# Patient Record
Sex: Female | Born: 1990
Health system: Southern US, Community
[De-identification: ages and names within clinical notes are randomized; demographics above are authoritative.]

## PROBLEM LIST (undated history)

## (undated) DIAGNOSIS — R87629 Unspecified abnormal cytological findings in specimens from vagina: Secondary | ICD-10-CM

## (undated) DIAGNOSIS — Z789 Other specified health status: Secondary | ICD-10-CM

## (undated) HISTORY — PX: NO PAST SURGERIES: SHX2092

## (undated) HISTORY — DX: Other specified health status: Z78.9

## (undated) HISTORY — DX: Unspecified abnormal cytological findings in specimens from vagina: R87.629

---
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
## 2011-07-08 ENCOUNTER — Ambulatory Visit (INDEPENDENT_AMBULATORY_CARE_PROVIDER_SITE_OTHER): Payer: Self-pay | Admitting: Family Medicine

## 2011-07-08 ENCOUNTER — Other Ambulatory Visit (HOSPITAL_COMMUNITY)
Admission: RE | Admit: 2011-07-08 | Discharge: 2011-07-08 | Disposition: A | Discharge status: Home / Self Care | Payer: Self-pay | Source: Ambulatory Visit | Attending: Family Medicine | Admitting: Family Medicine

## 2011-07-08 ENCOUNTER — Encounter: Payer: Self-pay | Admitting: Family Medicine

## 2011-07-08 VITALS — BP 104/74 | HR 66 | Temp 98.1°F | Ht 61.0 in | Wt 127.4 lb

## 2011-07-08 DIAGNOSIS — Z0000 Encounter for general adult medical examination without abnormal findings: Secondary | ICD-10-CM

## 2011-07-08 DIAGNOSIS — Z01419 Encounter for gynecological examination (general) (routine) without abnormal findings: Secondary | ICD-10-CM | POA: Insufficient documentation

## 2011-07-08 LAB — LIPID PANEL
Cholesterol: 144 mg/dL (ref 0–200)
VLDL: 10.8 mg/dL (ref 0.0–40.0)

## 2011-07-08 LAB — TSH: TSH: 0.87 u[IU]/mL (ref 0.35–5.50)

## 2011-07-08 LAB — HEPATIC FUNCTION PANEL
ALT: 23 U/L (ref 0–35)
AST: 29 U/L (ref 0–37)
Albumin: 4.1 g/dL (ref 3.5–5.2)
Alkaline Phosphatase: 71 U/L (ref 39–117)
Total Protein: 7.6 g/dL (ref 6.0–8.3)

## 2011-07-08 LAB — POCT URINALYSIS DIPSTICK
Ketones, UA: NEGATIVE
Leukocytes, UA: NEGATIVE
Nitrite, UA: NEGATIVE
Protein, UA: NEGATIVE

## 2011-07-08 LAB — CBC WITH DIFFERENTIAL/PLATELET
Basophils Absolute: 0 10*3/uL (ref 0.0–0.1)
Eosinophils Relative: 2.9 % (ref 0.0–5.0)
Lymphocytes Relative: 48.9 % — ABNORMAL HIGH (ref 12.0–46.0)
Monocytes Relative: 8.7 % (ref 3.0–12.0)
Neutrophils Relative %: 38.6 % — ABNORMAL LOW (ref 43.0–77.0)
Platelets: 230 10*3/uL (ref 150.0–400.0)
RDW: 13.4 % (ref 11.5–14.6)
WBC: 3.6 10*3/uL — ABNORMAL LOW (ref 4.5–10.5)

## 2011-07-08 LAB — BASIC METABOLIC PANEL
BUN: 11 mg/dL (ref 6–23)
CO2: 27 mEq/L (ref 19–32)
Chloride: 105 mEq/L (ref 96–112)
GFR: 90.52 mL/min (ref >60.00)
Glucose, Bld: 59 mg/dL — ABNORMAL LOW (ref 70–99)
Potassium: 3.9 mEq/L (ref 3.5–5.1)

## 2011-07-08 LAB — POCT URINE PREGNANCY: Preg Test, Ur: NEGATIVE

## 2011-07-08 NOTE — Patient Instructions (Signed)

## 2011-07-08 NOTE — Progress Notes (Signed)
  Subjective:     Erica Kaiser is a 20 y.o. female and is here for a comprehensive physical exam. The patient reports problems - + irregular periods.  History   Social History  . Marital Status: Single    Spouse Name: N/A    Number of Children: N/A  . Years of Education: N/A   Occupational History  . [employer] student    Social History Main Topics  . Smoking status: Never Smoker   . Smokeless tobacco: Never Used  . Alcohol Use: No  . Drug Use: No  . Sexually Active: Yes -- Female partner(s)   Other Topics Concern  . Not on file   Social History Narrative   Exercises daily for 3-4 hours   No health maintenance topics applied.  The following portions of the patient's history were reviewed and updated as appropriate: allergies, current medications, past family history, past medical history, past social history, past surgical history and problem list.  Review of Systems Review of Systems  Constitutional: Negative for activity change, appetite change and fatigue.  HENT: Negative for hearing loss, congestion, tinnitus and ear discharge.  dentist q73m Eyes: Negative for visual disturbance (see optho q1y -- vision corrected to 20/20 with glasses).  Respiratory: Negative for cough, chest tightness and shortness of breath.   Cardiovascular: Negative for chest pain, palpitations and leg swelling.  Gastrointestinal: Negative for abdominal pain, diarrhea, constipation and abdominal distention.  Genitourinary: Negative for urgency, frequency, decreased urine volume and difficulty urinating.  Musculoskeletal: Negative for back pain, arthralgias and gait problem.  Skin: Negative for color change, pallor and rash.  Neurological: Negative for dizziness, light-headedness, numbness and headaches.  Hematological: Negative for adenopathy. Does not bruise/bleed easily.  Psychiatric/Behavioral: Negative for suicidal ideas, confusion, sleep disturbance, self-injury, dysphoric mood, decreased  concentration and agitation.       Objective:    BP 104/74  Pulse 66  Temp(Src) 98.1 F (36.7 C) (Oral)  Ht 5\' 1"  (1.549 m)  Wt 127 lb 6.4 oz (57.788 kg)  BMI 24.07 kg/m2  SpO2 99%  LMP 02/19/2011 General appearance: alert, cooperative, appears stated age and no distress Head: Normocephalic, without obvious abnormality, atraumatic Eyes: conjunctivae/corneas clear. PERRL, EOM's intact. Fundi benign. Ears: normal TM's and external ear canals both ears Nose: Nares normal. Septum midline. Mucosa normal. No drainage or sinus tenderness. Throat: lips, mucosa, and tongue normal; teeth and gums normal Neck: no adenopathy, no carotid bruit, no JVD, supple, symmetrical, trachea midline and thyroid not enlarged, symmetric, no tenderness/mass/nodules Back: symmetric, no curvature. ROM normal. No CVA tenderness. Lungs: clear to auscultation bilaterally Breasts: normal appearance, no masses or tenderness Heart: regular rate and rhythm, S1, S2 normal, no murmur, click, rub or gallop Abdomen: soft, non-tender; bowel sounds normal; no masses,  no organomegaly Pelvic: cervix normal in appearance, external genitalia normal, no adnexal masses or tenderness, no cervical motion tenderness, rectovaginal septum normal, uterus normal size, shape, and consistency and vagina normal without discharge Extremities: extremities normal, atraumatic, no cyanosis or edema Pulses: 2+ and symmetric Skin: Skin color, texture, turgor normal. No rashes or lesions Lymph nodes: Cervical, supraclavicular, and axillary nodes normal. Neurologic: Alert and oriented X 3, normal strength and tone. Normal symmetric reflexes. Normal coordination and gait psych--no depression, no anxiety    Assessment:    Healthy female exam Irregular periods---start yaz     Plan:    check fasting labs  ghm utd  See After Visit Summary for Counseling Recommendations

## 2011-07-09 ENCOUNTER — Telehealth: Payer: Self-pay | Admitting: Family Medicine

## 2011-07-09 MED ORDER — NORGESTIMATE-ETH ESTRADIOL 0.25-35 MG-MCG PO TABS: 1.0000 | ORAL_TABLET | Freq: Every day | ORAL | Status: DC

## 2011-07-09 NOTE — Telephone Encounter (Signed)
Left Detail message that Rx sent to pharmacy and to contact office with any further concerns.

## 2011-07-09 NOTE — Telephone Encounter (Signed)
Please advise 

## 2011-07-09 NOTE — Telephone Encounter (Signed)
Can switch to Sprintec (ortho-cyclen) daily, disp 1 pill pack, 11 refills

## 2011-07-13 ENCOUNTER — Telehealth: Payer: Self-pay

## 2011-07-13 DIAGNOSIS — IMO0002 Reserved for concepts with insufficient information to code with codable children: Secondary | ICD-10-CM

## 2011-07-13 DIAGNOSIS — R87610 Atypical squamous cells of undetermined significance on cytologic smear of cervix (ASC-US): Secondary | ICD-10-CM

## 2011-07-13 NOTE — Telephone Encounter (Signed)
Message copied by Arnette Norris on Tue Jul 13, 2011  8:37 AM ------      Message from: Lelon Perla      Created: Mon Jul 12, 2011  9:00 AM       + HPV---LSIL---needs GYN referral for colposcopy------

## 2011-07-13 NOTE — Telephone Encounter (Signed)
msg left to call the office     KP 

## 2011-07-15 NOTE — Telephone Encounter (Signed)
msg left to call...referral put in     KP

## 2011-07-16 NOTE — Telephone Encounter (Signed)
msg left for mother to return call     KP

## 2011-07-20 NOTE — Telephone Encounter (Signed)
After several attempts to contact patient ----Letter has been mailed    KP

## 2011-07-22 ENCOUNTER — Telehealth: Payer: Self-pay

## 2011-07-22 ENCOUNTER — Telehealth: Payer: Self-pay | Admitting: Family Medicine

## 2011-07-22 NOTE — Telephone Encounter (Signed)
Pt's mother calls with the fax number to send pts' pap results Fax # [phone]   Faxed      KP

## 2011-07-22 NOTE — Telephone Encounter (Signed)
Faxed.   KP 

## 2011-08-23 ENCOUNTER — Telehealth: Payer: Self-pay | Admitting: Family Medicine

## 2011-08-23 NOTE — Telephone Encounter (Signed)
This patient went back to school and was seen by a gyn there. I faxed the results to them in October.    KP

## 2015-08-05 ENCOUNTER — Ambulatory Visit: Payer: Self-pay | Admitting: Family Medicine

## 2015-09-24 ENCOUNTER — Ambulatory Visit: Payer: Self-pay | Admitting: Medical

## 2015-09-25 ENCOUNTER — Ambulatory Visit: Payer: Self-pay | Admitting: Medical

## 2015-11-19 ENCOUNTER — Encounter: Payer: Self-pay | Admitting: Medical

## 2015-11-19 NOTE — Progress Notes (Signed)
This encounter was created in error - please disregard.

## 2015-11-20 ENCOUNTER — Telehealth: Payer: Self-pay | Admitting: Family Medicine

## 2015-11-20 ENCOUNTER — Encounter: Payer: Self-pay | Admitting: Medical

## 2015-11-20 NOTE — Telephone Encounter (Signed)
Missed appt with you, I will mark to charge and mail no show letter

## 2015-11-20 NOTE — Telephone Encounter (Signed)
Pt was no show 11/19/15 9:15am for acute appt, pt has not had new pt appt yet (last OV with Dr. Laury Axon in 2012), charge or no charge?

## 2015-11-20 NOTE — Telephone Encounter (Signed)
Did she miss appoitment with me or Dr. Laury Axon. Double check. If missed appoitment with me charge. If with Dr. Laury Axon please ask her?

## 2015-12-22 ENCOUNTER — Ambulatory Visit: Payer: Self-pay | Admitting: Family Medicine

## 2015-12-22 ENCOUNTER — Telehealth: Payer: Self-pay | Admitting: Family Medicine

## 2015-12-22 DIAGNOSIS — Z0289 Encounter for other administrative examinations: Secondary | ICD-10-CM

## 2015-12-24 NOTE — Telephone Encounter (Signed)
Pt was no show 12/22/15 1:30PM for new pt appt, pt already had appt scheduled for 4/17 for cpe (I changed to new pt since she is not established), this is 2nd no show and pt has had 4 cancellations. Charge or no charge? Please advise.

## 2015-12-25 NOTE — Telephone Encounter (Signed)
charge 

## 2015-12-26 ENCOUNTER — Encounter: Payer: Self-pay | Admitting: Family Medicine

## 2015-12-26 NOTE — Telephone Encounter (Signed)
Marked to charge and mailing no show letter °

## 2016-01-01 ENCOUNTER — Telehealth: Payer: Self-pay | Admitting: *Deleted

## 2016-01-01 NOTE — Telephone Encounter (Signed)
Unable to reach patient at time of pre-visit call. No answer, no voicemail.

## 2016-01-05 ENCOUNTER — Encounter: Payer: Self-pay | Admitting: Family Medicine

## 2016-01-05 ENCOUNTER — Ambulatory Visit (INDEPENDENT_AMBULATORY_CARE_PROVIDER_SITE_OTHER): Payer: BC Managed Care – PPO | Admitting: Family Medicine

## 2016-01-05 VITALS — BP 109/73 | HR 63 | Temp 98.3°F | Ht 62.0 in | Wt 135.2 lb

## 2016-01-05 DIAGNOSIS — Z0000 Encounter for general adult medical examination without abnormal findings: Secondary | ICD-10-CM

## 2016-01-05 DIAGNOSIS — R319 Hematuria, unspecified: Secondary | ICD-10-CM | POA: Diagnosis not present

## 2016-01-05 DIAGNOSIS — N926 Irregular menstruation, unspecified: Secondary | ICD-10-CM | POA: Diagnosis not present

## 2016-01-05 DIAGNOSIS — Z114 Encounter for screening for human immunodeficiency virus [HIV]: Secondary | ICD-10-CM

## 2016-01-05 LAB — POCT URINALYSIS DIPSTICK
BILIRUBIN UA: NEGATIVE
Glucose, UA: NEGATIVE
KETONES UA: NEGATIVE
LEUKOCYTES UA: NEGATIVE
Nitrite, UA: NEGATIVE
PH UA: 8
Protein, UA: NEGATIVE
Spec Grav, UA: 1.02
Urobilinogen, UA: 0.2

## 2016-01-05 MED ORDER — NORGESTIMATE-ETH ESTRADIOL 0.25-35 MG-MCG PO TABS: 1.0000 | ORAL_TABLET | Freq: Every day | ORAL | Status: DC

## 2016-01-05 NOTE — Patient Instructions (Signed)
Preventive Care for Adults, Female A healthy lifestyle and preventive care can promote health and wellness. Preventive health guidelines for women include the following key practices.  A routine yearly physical is a good way to check with your health care provider about your health and preventive screening. It is a chance to share any concerns and updates on your health and to receive a thorough exam.  Visit your dentist for a routine exam and preventive care every 6 months. Brush your teeth twice a day and floss once a day. Good oral hygiene prevents tooth decay and gum disease.  The frequency of eye exams is based on your age, health, family medical history, use of contact lenses, and other factors. Follow your health care provider's recommendations for frequency of eye exams.  Eat a healthy diet. Foods like vegetables, fruits, whole grains, low-fat dairy products, and lean protein foods contain the nutrients you need without too many calories. Decrease your intake of foods high in solid fats, added sugars, and salt. Eat the right amount of calories for you.Get information about a proper diet from your health care provider, if necessary.  Regular physical exercise is one of the most important things you can do for your health. Most adults should get at least 150 minutes of moderate-intensity exercise (any activity that increases your heart rate and causes you to sweat) each week. In addition, most adults need muscle-strengthening exercises on 2 or more days a week.  Maintain a healthy weight. The body mass index (BMI) is a screening tool to identify possible weight problems. It provides an estimate of body fat based on height and weight. Your health care provider can find your BMI and can help you achieve or maintain a healthy weight.For adults 20 years and older:  A BMI below 18.5 is considered underweight.  A BMI of 18.5 to 24.9 is normal.  A BMI of 25 to 29.9 is considered overweight.  A  BMI of 30 and above is considered obese.  Maintain normal blood lipids and cholesterol levels by exercising and minimizing your intake of saturated fat. Eat a balanced diet with plenty of fruit and vegetables. Blood tests for lipids and cholesterol should begin at age 45 and be repeated every 5 years. If your lipid or cholesterol levels are high, you are over 50, or you are at high risk for heart disease, you may need your cholesterol levels checked more frequently.Ongoing high lipid and cholesterol levels should be treated with medicines if diet and exercise are not working.  If you smoke, find out from your health care provider how to quit. If you do not use tobacco, do not start.  Lung cancer screening is recommended for adults aged 45-80 years who are at high risk for developing lung cancer because of a history of smoking. A yearly low-dose CT scan of the lungs is recommended for people who have at least a 30-pack-year history of smoking and are a current smoker or have quit within the past 15 years. A pack year of smoking is smoking an average of 1 pack of cigarettes a day for 1 year (for example: 1 pack a day for 30 years or 2 packs a day for 15 years). Yearly screening should continue until the smoker has stopped smoking for at least 15 years. Yearly screening should be stopped for people who develop a health problem that would prevent them from having lung cancer treatment.  If you are pregnant, do not drink alcohol. If you are  breastfeeding, be very cautious about drinking alcohol. If you are not pregnant and choose to drink alcohol, do not have more than 1 drink per day. One drink is considered to be 12 ounces (355 mL) of beer, 5 ounces (148 mL) of wine, or 1.5 ounces (44 mL) of liquor.  Avoid use of street drugs. Do not share needles with anyone. Ask for help if you need support or instructions about stopping the use of drugs.  High blood pressure causes heart disease and increases the risk  of stroke. Your blood pressure should be checked at least every 1 to 2 years. Ongoing high blood pressure should be treated with medicines if weight loss and exercise do not work.  If you are 55-79 years old, ask your health care provider if you should take aspirin to prevent strokes.  Diabetes screening is done by taking a blood sample to check your blood glucose level after you have not eaten for a certain period of time (fasting). If you are not overweight and you do not have risk factors for diabetes, you should be screened once every 3 years starting at age 45. If you are overweight or obese and you are 40-70 years of age, you should be screened for diabetes every year as part of your cardiovascular risk assessment.  Breast cancer screening is essential preventive care for women. You should practice "breast self-awareness." This means understanding the normal appearance and feel of your breasts and may include breast self-examination. Any changes detected, no matter how small, should be reported to a health care provider. Women in their 20s and 30s should have a clinical breast exam (CBE) by a health care provider as part of a regular health exam every 1 to 3 years. After age 40, women should have a CBE every year. Starting at age 40, women should consider having a mammogram (breast X-ray test) every year. Women who have a family history of breast cancer should talk to their health care provider about genetic screening. Women at a high risk of breast cancer should talk to their health care providers about having an MRI and a mammogram every year.  Breast cancer gene (BRCA)-related cancer risk assessment is recommended for women who have family members with BRCA-related cancers. BRCA-related cancers include breast, ovarian, tubal, and peritoneal cancers. Having family members with these cancers may be associated with an increased risk for harmful changes (mutations) in the breast cancer genes BRCA1 and  BRCA2. Results of the assessment will determine the need for genetic counseling and BRCA1 and BRCA2 testing.  Your health care provider may recommend that you be screened regularly for cancer of the pelvic organs (ovaries, uterus, and vagina). This screening involves a pelvic examination, including checking for microscopic changes to the surface of your cervix (Pap test). You may be encouraged to have this screening done every 3 years, beginning at age 21.  For women ages 30-65, health care providers may recommend pelvic exams and Pap testing every 3 years, or they may recommend the Pap and pelvic exam, combined with testing for human papilloma virus (HPV), every 5 years. Some types of HPV increase your risk of cervical cancer. Testing for HPV may also be done on women of any age with unclear Pap test results.  Other health care providers may not recommend any screening for nonpregnant women who are considered low risk for pelvic cancer and who do not have symptoms. Ask your health care provider if a screening pelvic exam is right for   you.  If you have had past treatment for cervical cancer or a condition that could lead to cancer, you need Pap tests and screening for cancer for at least 20 years after your treatment. If Pap tests have been discontinued, your risk factors (such as having a new sexual partner) need to be reassessed to determine if screening should resume. Some women have medical problems that increase the chance of getting cervical cancer. In these cases, your health care provider may recommend more frequent screening and Pap tests.  Colorectal cancer can be detected and often prevented. Most routine colorectal cancer screening begins at the age of 50 years and continues through age 75 years. However, your health care provider may recommend screening at an earlier age if you have risk factors for colon cancer. On a yearly basis, your health care provider may provide home test kits to check  for hidden blood in the stool. Use of a small camera at the end of a tube, to directly examine the colon (sigmoidoscopy or colonoscopy), can detect the earliest forms of colorectal cancer. Talk to your health care provider about this at age 50, when routine screening begins. Direct exam of the colon should be repeated every 5-10 years through age 75 years, unless early forms of precancerous polyps or small growths are found.  People who are at an increased risk for hepatitis B should be screened for this virus. You are considered at high risk for hepatitis B if:  You were born in a country where hepatitis B occurs often. Talk with your health care provider about which countries are considered high risk.  Your parents were born in a high-risk country and you have not received a shot to protect against hepatitis B (hepatitis B vaccine).  You have HIV or AIDS.  You use needles to inject street drugs.  You live with, or have sex with, someone who has hepatitis B.  You get hemodialysis treatment.  You take certain medicines for conditions like cancer, organ transplantation, and autoimmune conditions.  Hepatitis C blood testing is recommended for all people born from 1945 through 1965 and any individual with known risks for hepatitis C.  Practice safe sex. Use condoms and avoid high-risk sexual practices to reduce the spread of sexually transmitted infections (STIs). STIs include gonorrhea, chlamydia, syphilis, trichomonas, herpes, HPV, and human immunodeficiency virus (HIV). Herpes, HIV, and HPV are viral illnesses that have no cure. They can result in disability, cancer, and death.  You should be screened for sexually transmitted illnesses (STIs) including gonorrhea and chlamydia if:  You are sexually active and are younger than 24 years.  You are older than 24 years and your health care provider tells you that you are at risk for this type of infection.  Your sexual activity has changed  since you were last screened and you are at an increased risk for chlamydia or gonorrhea. Ask your health care provider if you are at risk.  If you are at risk of being infected with HIV, it is recommended that you take a prescription medicine daily to prevent HIV infection. This is called preexposure prophylaxis (PrEP). You are considered at risk if:  You are sexually active and do not regularly use condoms or know the HIV status of your partner(s).  You take drugs by injection.  You are sexually active with a partner who has HIV.  Talk with your health care provider about whether you are at high risk of being infected with HIV. If   you choose to begin PrEP, you should first be tested for HIV. You should then be tested every 3 months for as long as you are taking PrEP.  Osteoporosis is a disease in which the bones lose minerals and strength with aging. This can result in serious bone fractures or breaks. The risk of osteoporosis can be identified using a bone density scan. Women ages 67 years and over and women at risk for fractures or osteoporosis should discuss screening with their health care providers. Ask your health care provider whether you should take a calcium supplement or vitamin D to reduce the rate of osteoporosis.  Menopause can be associated with physical symptoms and risks. Hormone replacement therapy is available to decrease symptoms and risks. You should talk to your health care provider about whether hormone replacement therapy is right for you.  Use sunscreen. Apply sunscreen liberally and repeatedly throughout the day. You should seek shade when your shadow is shorter than you. Protect yourself by wearing long sleeves, pants, a wide-brimmed hat, and sunglasses year round, whenever you are outdoors.  Once a month, do a whole body skin exam, using a mirror to look at the skin on your back. Tell your health care provider of new moles, moles that have irregular borders, moles that  are larger than a pencil eraser, or moles that have changed in shape or color.  Stay current with required vaccines (immunizations).  Influenza vaccine. All adults should be immunized every year.  Tetanus, diphtheria, and acellular pertussis (Td, Tdap) vaccine. Pregnant women should receive 1 dose of Tdap vaccine during each pregnancy. The dose should be obtained regardless of the length of time since the last dose. Immunization is preferred during the 27th-36th week of gestation. An adult who has not previously received Tdap or who does not know her vaccine status should receive 1 dose of Tdap. This initial dose should be followed by tetanus and diphtheria toxoids (Td) booster doses every 10 years. Adults with an unknown or incomplete history of completing a 3-dose immunization series with Td-containing vaccines should begin or complete a primary immunization series including a Tdap dose. Adults should receive a Td booster every 10 years.  Varicella vaccine. An adult without evidence of immunity to varicella should receive 2 doses or a second dose if she has previously received 1 dose. Pregnant females who do not have evidence of immunity should receive the first dose after pregnancy. This first dose should be obtained before leaving the health care facility. The second dose should be obtained 4-8 weeks after the first dose.  Human papillomavirus (HPV) vaccine. Females aged 13-26 years who have not received the vaccine previously should obtain the 3-dose series. The vaccine is not recommended for use in pregnant females. However, pregnancy testing is not needed before receiving a dose. If a female is found to be pregnant after receiving a dose, no treatment is needed. In that case, the remaining doses should be delayed until after the pregnancy. Immunization is recommended for any person with an immunocompromised condition through the age of 61 years if she did not get any or all doses earlier. During the  3-dose series, the second dose should be obtained 4-8 weeks after the first dose. The third dose should be obtained 24 weeks after the first dose and 16 weeks after the second dose.  Zoster vaccine. One dose is recommended for adults aged 30 years or older unless certain conditions are present.  Measles, mumps, and rubella (MMR) vaccine. Adults born  before 1957 generally are considered immune to measles and mumps. Adults born in 1957 or later should have 1 or more doses of MMR vaccine unless there is a contraindication to the vaccine or there is laboratory evidence of immunity to each of the three diseases. A routine second dose of MMR vaccine should be obtained at least 28 days after the first dose for students attending postsecondary schools, health care workers, or international travelers. People who received inactivated measles vaccine or an unknown type of measles vaccine during 1963-1967 should receive 2 doses of MMR vaccine. People who received inactivated mumps vaccine or an unknown type of mumps vaccine before 1979 and are at high risk for mumps infection should consider immunization with 2 doses of MMR vaccine. For females of childbearing age, rubella immunity should be determined. If there is no evidence of immunity, females who are not pregnant should be vaccinated. If there is no evidence of immunity, females who are pregnant should delay immunization until after pregnancy. Unvaccinated health care workers born before 1957 who lack laboratory evidence of measles, mumps, or rubella immunity or laboratory confirmation of disease should consider measles and mumps immunization with 2 doses of MMR vaccine or rubella immunization with 1 dose of MMR vaccine.  Pneumococcal 13-valent conjugate (PCV13) vaccine. When indicated, a person who is uncertain of his immunization history and has no record of immunization should receive the PCV13 vaccine. All adults 65 years of age and older should receive this  vaccine. An adult aged 19 years or older who has certain medical conditions and has not been previously immunized should receive 1 dose of PCV13 vaccine. This PCV13 should be followed with a dose of pneumococcal polysaccharide (PPSV23) vaccine. Adults who are at high risk for pneumococcal disease should obtain the PPSV23 vaccine at least 8 weeks after the dose of PCV13 vaccine. Adults older than 24 years of age who have normal immune system function should obtain the PPSV23 vaccine dose at least 1 year after the dose of PCV13 vaccine.  Pneumococcal polysaccharide (PPSV23) vaccine. When PCV13 is also indicated, PCV13 should be obtained first. All adults aged 65 years and older should be immunized. An adult younger than age 65 years who has certain medical conditions should be immunized. Any person who resides in a nursing home or long-term care facility should be immunized. An adult smoker should be immunized. People with an immunocompromised condition and certain other conditions should receive both PCV13 and PPSV23 vaccines. People with human immunodeficiency virus (HIV) infection should be immunized as soon as possible after diagnosis. Immunization during chemotherapy or radiation therapy should be avoided. Routine use of PPSV23 vaccine is not recommended for American Indians, [place] Natives, or people younger than 65 years unless there are medical conditions that require PPSV23 vaccine. When indicated, people who have unknown immunization and have no record of immunization should receive PPSV23 vaccine. One-time revaccination 5 years after the first dose of PPSV23 is recommended for people aged 19-64 years who have chronic kidney failure, nephrotic syndrome, asplenia, or immunocompromised conditions. People who received 1-2 doses of PPSV23 before age 65 years should receive another dose of PPSV23 vaccine at age 65 years or later if at least 5 years have passed since the previous dose. Doses of PPSV23 are not  needed for people immunized with PPSV23 at or after age 65 years.  Meningococcal vaccine. Adults with asplenia or persistent complement component deficiencies should receive 2 doses of quadrivalent meningococcal conjugate (MenACWY-D) vaccine. The doses should be obtained   at least 2 months apart. Microbiologists working with certain meningococcal bacteria, [place] recruits, people at risk during an outbreak, and people who travel to or live in countries with a high rate of meningitis should be immunized. A first-year college student up through age 34 years who is living in a residence hall should receive a dose if she did not receive a dose on or after her 16th birthday. Adults who have certain high-risk conditions should receive one or more doses of vaccine.  Hepatitis A vaccine. Adults who wish to be protected from this disease, have certain high-risk conditions, work with hepatitis A-infected animals, work in hepatitis A research labs, or travel to or work in countries with a high rate of hepatitis A should be immunized. Adults who were previously unvaccinated and who anticipate close contact with an international adoptee during the first 60 days after arrival in the [place] from a country with a high rate of hepatitis A should be immunized.  Hepatitis B vaccine. Adults who wish to be protected from this disease, have certain high-risk conditions, may be exposed to blood or other infectious body fluids, are household contacts or sex partners of hepatitis B positive people, are clients or workers in certain care facilities, or travel to or work in countries with a high rate of hepatitis B should be immunized.  Haemophilus influenzae type b (Hib) vaccine. A previously unvaccinated person with asplenia or sickle cell disease or having a scheduled splenectomy should receive 1 dose of Hib vaccine. Regardless of previous immunization, a recipient of a hematopoietic stem cell transplant should receive a  3-dose series 6-12 months after her successful transplant. Hib vaccine is not recommended for adults with HIV infection. Preventive Services / Frequency Ages 35 to 4 years  Blood pressure check.** / Every 3-5 years.  Lipid and cholesterol check.** / Every 5 years beginning at age 60.  Clinical breast exam.** / Every 3 years for women in their 71s and 10s.  BRCA-related cancer risk assessment.** / For women who have family members with a BRCA-related cancer (breast, ovarian, tubal, or peritoneal cancers).  Pap test.** / Every 2 years from ages 76 through 26. Every 3 years starting at age 61 through age 76 or 93 with a history of 3 consecutive normal Pap tests.  HPV screening.** / Every 3 years from ages 37 through ages 60 to 51 with a history of 3 consecutive normal Pap tests.  Hepatitis C blood test.** / For any individual with known risks for hepatitis C.  Skin self-exam. / Monthly.  Influenza vaccine. / Every year.  Tetanus, diphtheria, and acellular pertussis (Tdap, Td) vaccine.** / Consult your health care provider. Pregnant women should receive 1 dose of Tdap vaccine during each pregnancy. 1 dose of Td every 10 years.  Varicella vaccine.** / Consult your health care provider. Pregnant females who do not have evidence of immunity should receive the first dose after pregnancy.  HPV vaccine. / 3 doses over 6 months, if 93 and younger. The vaccine is not recommended for use in pregnant females. However, pregnancy testing is not needed before receiving a dose.  Measles, mumps, rubella (MMR) vaccine.** / You need at least 1 dose of MMR if you were born in 1957 or later. You may also need a 2nd dose. For females of childbearing age, rubella immunity should be determined. If there is no evidence of immunity, females who are not pregnant should be vaccinated. If there is no evidence of immunity, females who are  pregnant should delay immunization until after pregnancy.  Pneumococcal  13-valent conjugate (PCV13) vaccine.** / Consult your health care provider.  Pneumococcal polysaccharide (PPSV23) vaccine.** / 1 to 2 doses if you smoke cigarettes or if you have certain conditions.  Meningococcal vaccine.** / 1 dose if you are age 68 to 8 years and a Market researcher living in a residence hall, or have one of several medical conditions, you need to get vaccinated against meningococcal disease. You may also need additional booster doses.  Hepatitis A vaccine.** / Consult your health care provider.  Hepatitis B vaccine.** / Consult your health care provider.  Haemophilus influenzae type b (Hib) vaccine.** / Consult your health care provider. Ages 7 to 53 years  Blood pressure check.** / Every year.  Lipid and cholesterol check.** / Every 5 years beginning at age 25 years.  Lung cancer screening. / Every year if you are aged 11-80 years and have a 30-pack-year history of smoking and currently smoke or have quit within the past 15 years. Yearly screening is stopped once you have quit smoking for at least 15 years or develop a health problem that would prevent you from having lung cancer treatment.  Clinical breast exam.** / Every year after age 48 years.  BRCA-related cancer risk assessment.** / For women who have family members with a BRCA-related cancer (breast, ovarian, tubal, or peritoneal cancers).  Mammogram.** / Every year beginning at age 41 years and continuing for as long as you are in good health. Consult with your health care provider.  Pap test.** / Every 3 years starting at age 65 years through age 37 or 70 years with a history of 3 consecutive normal Pap tests.  HPV screening.** / Every 3 years from ages 72 years through ages 60 to 40 years with a history of 3 consecutive normal Pap tests.  Fecal occult blood test (FOBT) of stool. / Every year beginning at age 21 years and continuing until age 5 years. You may not need to do this test if you get  a colonoscopy every 10 years.  Flexible sigmoidoscopy or colonoscopy.** / Every 5 years for a flexible sigmoidoscopy or every 10 years for a colonoscopy beginning at age 35 years and continuing until age 48 years.  Hepatitis C blood test.** / For all people born from [birth year] through 1965 and any individual with known risks for hepatitis C.  Skin self-exam. / Monthly.  Influenza vaccine. / Every year.  Tetanus, diphtheria, and acellular pertussis (Tdap/Td) vaccine.** / Consult your health care provider. Pregnant women should receive 1 dose of Tdap vaccine during each pregnancy. 1 dose of Td every 10 years.  Varicella vaccine.** / Consult your health care provider. Pregnant females who do not have evidence of immunity should receive the first dose after pregnancy.  Zoster vaccine.** / 1 dose for adults aged 30 years or older.  Measles, mumps, rubella (MMR) vaccine.** / You need at least 1 dose of MMR if you were born in 1957 or later. You may also need a second dose. For females of childbearing age, rubella immunity should be determined. If there is no evidence of immunity, females who are not pregnant should be vaccinated. If there is no evidence of immunity, females who are pregnant should delay immunization until after pregnancy.  Pneumococcal 13-valent conjugate (PCV13) vaccine.** / Consult your health care provider.  Pneumococcal polysaccharide (PPSV23) vaccine.** / 1 to 2 doses if you smoke cigarettes or if you have certain conditions.  Meningococcal vaccine.** /  Consult your health care provider.  Hepatitis A vaccine.** / Consult your health care provider.  Hepatitis B vaccine.** / Consult your health care provider.  Haemophilus influenzae type b (Hib) vaccine.** / Consult your health care provider. Ages 64 years and over  Blood pressure check.** / Every year.  Lipid and cholesterol check.** / Every 5 years beginning at age 23 years.  Lung cancer screening. / Every year if you  are aged 16-80 years and have a 30-pack-year history of smoking and currently smoke or have quit within the past 15 years. Yearly screening is stopped once you have quit smoking for at least 15 years or develop a health problem that would prevent you from having lung cancer treatment.  Clinical breast exam.** / Every year after age 74 years.  BRCA-related cancer risk assessment.** / For women who have family members with a BRCA-related cancer (breast, ovarian, tubal, or peritoneal cancers).  Mammogram.** / Every year beginning at age 44 years and continuing for as long as you are in good health. Consult with your health care provider.  Pap test.** / Every 3 years starting at age 58 years through age 22 or 39 years with 3 consecutive normal Pap tests. Testing can be stopped between 65 and 70 years with 3 consecutive normal Pap tests and no abnormal Pap or HPV tests in the past 10 years.  HPV screening.** / Every 3 years from ages 64 years through ages 70 or 61 years with a history of 3 consecutive normal Pap tests. Testing can be stopped between 65 and 70 years with 3 consecutive normal Pap tests and no abnormal Pap or HPV tests in the past 10 years.  Fecal occult blood test (FOBT) of stool. / Every year beginning at age 40 years and continuing until age 27 years. You may not need to do this test if you get a colonoscopy every 10 years.  Flexible sigmoidoscopy or colonoscopy.** / Every 5 years for a flexible sigmoidoscopy or every 10 years for a colonoscopy beginning at age 7 years and continuing until age 32 years.  Hepatitis C blood test.** / For all people born from [birth year] through 1964 and any individual with known risks for hepatitis C.  Osteoporosis screening.** / A one-time screening for women ages 30 years and over and women at risk for fractures or osteoporosis.  Skin self-exam. / Monthly.  Influenza vaccine. / Every year.  Tetanus, diphtheria, and acellular pertussis (Tdap/Td)  vaccine.** / 1 dose of Td every 10 years.  Varicella vaccine.** / Consult your health care provider.  Zoster vaccine.** / 1 dose for adults aged 35 years or older.  Pneumococcal 13-valent conjugate (PCV13) vaccine.** / Consult your health care provider.  Pneumococcal polysaccharide (PPSV23) vaccine.** / 1 dose for all adults aged 46 years and older.  Meningococcal vaccine.** / Consult your health care provider.  Hepatitis A vaccine.** / Consult your health care provider.  Hepatitis B vaccine.** / Consult your health care provider.  Haemophilus influenzae type b (Hib) vaccine.** / Consult your health care provider. ** Family history and personal history of risk and conditions may change your health care provider's recommendations.   This information is not intended to replace advice given to you by your health care provider. Make sure you discuss any questions you have with your health care provider.   Document Released: 11/02/2001 Document Revised: 09/27/2014 Document Reviewed: 02/01/2011 Elsevier Interactive Patient Education [employer].

## 2016-01-05 NOTE — Progress Notes (Signed)
Subjective:     Erica Kaiser is a 25 y.o. female and is here for a comprehensive physical exam. The patient reports problems - irregular periods and hx abn pap.  Social History   Social History  . Marital Status: Single    Spouse Name: N/A  . Number of Children: N/A  . Years of Education: N/A   Occupational History  . Not on file.   Social History Main Topics  . Smoking status: Never Smoker   . Smokeless tobacco: Not on file  . Alcohol Use: 0.0 oz/week    0 Standard drinks or equivalent per week     Comment: occ  . Drug Use: No  . Sexual Activity: Not Currently   Other Topics Concern  . Not on file   Social History Narrative  . No narrative on file   Health Maintenance  Topic Date Due  . HIV Screening  09/11/2007  . INFLUENZA VACCINE  04/20/2016  . PAP SMEAR  09/20/2016  . TETANUS/TDAP  09/20/2020    The following portions of the patient's history were reviewed and updated as appropriate:  She  has no past medical history on file. She  does not have a problem list on file. She  has no past surgical history on file. Her family history includes Alcohol abuse in her maternal uncle and paternal uncle; Diabetes in her paternal grandmother; Drug abuse in her maternal uncle and paternal uncle; Hypertension in her father. She  reports that she has never smoked. She does not have any smokeless tobacco history on file. She reports that she drinks alcohol. She reports that she does not use illicit drugs. She currently has no medications in their medication list. No current outpatient prescriptions on file prior to visit.   No current facility-administered medications on file prior to visit.   She has No Known Allergies..  Review of Systems Review of Systems  Constitutional: Negative for activity change, appetite change and fatigue.  HENT: Negative for hearing loss, congestion, tinnitus and ear discharge.  dentist q38m Eyes: Negative for visual disturbance (see optho q1y  -- vision corrected to 20/20 with glasses).  Respiratory: Negative for cough, chest tightness and shortness of breath.   Cardiovascular: Negative for chest pain, palpitations and leg swelling.  Gastrointestinal: Negative for abdominal pain, diarrhea, constipation and abdominal distention.  Genitourinary: Negative for urgency, frequency, decreased urine volume and difficulty urinating.  Musculoskeletal: Negative for back pain, arthralgias and gait problem.  Skin: Negative for color change, pallor and rash.  Neurological: Negative for dizziness, light-headedness, numbness and headaches.  Hematological: Negative for adenopathy. Does not bruise/bleed easily.  Psychiatric/Behavioral: Negative for suicidal ideas, confusion, sleep disturbance, self-injury, dysphoric mood, decreased concentration and agitation.       Objective:    BP 109/73 mmHg  Pulse 63  Temp(Src) 98.3 F (36.8 C) (Oral)  Ht  (1.575 m)  Wt 135 lb 3.2 oz (61.326 kg)  BMI 24.72 kg/m2  SpO2 98%  LMP 01/05/2016 General appearance: alert, cooperative, appears stated age and no distress Head: Normocephalic, without obvious abnormality, atraumatic Eyes: conjunctivae/corneas clear. PERRL, EOM's intact. Fundi benign. Ears: normal TM's and external ear canals both ears Nose: Nares normal. Septum midline. Mucosa normal. No drainage or sinus tenderness. Throat: lips, mucosa, and tongue normal; teeth and gums normal Neck: no adenopathy, no carotid bruit, no JVD, supple, symmetrical, trachea midline and thyroid not enlarged, symmetric, no tenderness/mass/nodules Back: symmetric, no curvature. ROM normal. No CVA tenderness. Lungs: clear to auscultation bilaterally  Breasts: normal appearance, no masses or tenderness Heart: S1, S2 normal Abdomen: soft, non-tender; bowel sounds normal; no masses,  no organomegaly Pelvic: deferred Extremities: extremities normal, atraumatic, no cyanosis or edema Pulses: 2+ and symmetric Skin:  Skin color, texture, turgor normal. No rashes or lesions Lymph nodes: Cervical, supraclavicular, and axillary nodes normal. Neurologic: Alert and oriented X 3, normal strength and tone. Normal symmetric reflexes. Normal coordination and gait Psych- no depression, no anxiety      Assessment:    Healthy female exam.     Plan:    ghm utd Check labs See After Visit Summary for Counseling Recommendations     1. Preventative health care See above - Comprehensive metabolic panel - CBC with Differential/Platelet - Lipid panel - POCT urinalysis dipstick - TSH  2. Screening for HIV (human immunodeficiency virus) - HIV antibody  3. Irregular periods  - norgestimate-ethinyl estradiol (ORTHO-CYCLEN,SPRINTEC,PREVIFEM) 0.25-35 MG-MCG tablet; Take 1 tablet by mouth daily.  Dispense: 1 Package; Refill: 11

## 2016-01-05 NOTE — Addendum Note (Signed)
Addended by: Harley AltoPRICE, Taiga Lupinacci M on: 01/05/2016 03:54 PM   Modules accepted: Orders

## 2016-01-05 NOTE — Progress Notes (Signed)
Pre visit review using our clinic review tool, if applicable. No additional management support is needed unless otherwise documented below in the visit note. 

## 2016-01-06 LAB — CBC WITH DIFFERENTIAL/PLATELET
BASOS PCT: 1.1 % (ref 0.0–3.0)
Basophils Absolute: 0 10*3/uL (ref 0.0–0.1)
EOS PCT: 1.6 % (ref 0.0–5.0)
Eosinophils Absolute: 0.1 10*3/uL (ref 0.0–0.7)
HCT: 40.8 % (ref 36.0–46.0)
Hemoglobin: 13.6 g/dL (ref 12.0–15.0)
LYMPHS ABS: 2 10*3/uL (ref 0.7–4.0)
Lymphocytes Relative: 54.4 % — ABNORMAL HIGH (ref 12.0–46.0)
MCHC: 33.3 g/dL (ref 30.0–36.0)
MCV: 83.4 fl (ref 78.0–100.0)
MONO ABS: 0.3 10*3/uL (ref 0.1–1.0)
Monocytes Relative: 7.6 % (ref 3.0–12.0)
NEUTROS ABS: 1.3 10*3/uL — AB (ref 1.4–7.7)
NEUTROS PCT: 35.3 % — AB (ref 43.0–77.0)
PLATELETS: 256 10*3/uL (ref 150.0–400.0)
RBC: 4.89 Mil/uL (ref 3.87–5.11)
RDW: 13.1 % (ref 11.5–15.5)
WBC: 3.7 10*3/uL — ABNORMAL LOW (ref 4.0–10.5)

## 2016-01-06 LAB — COMPREHENSIVE METABOLIC PANEL
ALK PHOS: 47 U/L (ref 39–117)
ALT: 16 U/L (ref 0–35)
AST: 20 U/L (ref 0–37)
Albumin: 4 g/dL (ref 3.5–5.2)
BUN: 12 mg/dL (ref 6–23)
CHLORIDE: 105 meq/L (ref 96–112)
CO2: 29 meq/L (ref 19–32)
Calcium: 9.4 mg/dL (ref 8.4–10.5)
Creatinine, Ser: 0.91 mg/dL (ref 0.40–1.20)
GFR: 98.25 mL/min (ref >60.00)
GLUCOSE: 78 mg/dL (ref 70–99)
POTASSIUM: 4 meq/L (ref 3.5–5.1)
SODIUM: 138 meq/L (ref 135–145)
TOTAL PROTEIN: 7.3 g/dL (ref 6.0–8.3)
Total Bilirubin: 0.5 mg/dL (ref 0.2–1.2)

## 2016-01-06 LAB — LIPID PANEL
CHOLESTEROL: 133 mg/dL (ref 0–200)
HDL: 61.8 mg/dL (ref >39.00)
LDL Cholesterol: 58 mg/dL (ref 0–99)
NONHDL: 71.48
Total CHOL/HDL Ratio: 2
Triglycerides: 65 mg/dL (ref 0.0–149.0)
VLDL: 13 mg/dL (ref 0.0–40.0)

## 2016-01-06 LAB — TSH: TSH: 0.5 u[IU]/mL (ref 0.35–4.50)

## 2016-01-06 LAB — HIV ANTIBODY (ROUTINE TESTING W REFLEX): HIV: NONREACTIVE

## 2016-01-07 LAB — URINE CULTURE

## 2016-01-09 ENCOUNTER — Other Ambulatory Visit: Payer: Self-pay

## 2016-01-09 MED ORDER — CIPROFLOXACIN HCL 250 MG PO TABS: 250.0000 mg | ORAL_TABLET | Freq: Two times a day (BID) | ORAL | Status: DC

## 2016-04-09 ENCOUNTER — Telehealth: Payer: Self-pay | Admitting: Family Medicine

## 2016-04-09 NOTE — Telephone Encounter (Signed)
No one remembers and they can not remember where is was done either, all they know is that it was abnormal but she never followed up.,    KP

## 2016-04-09 NOTE — Telephone Encounter (Signed)
When was previous pap done-- ?  Does mom remember where she went--- depending on when it was done and what result was OCt may be ok.

## 2016-04-09 NOTE — Telephone Encounter (Signed)
Please advise      KP 

## 2016-04-09 NOTE — Telephone Encounter (Signed)
Relation to BJ:YNWGpt:self Call back number:[phone]   Reason for call:  Patient requesting a pap due to her previous pap being abnormal, patient states she doesn't recall GYN name who conducted pap. Next available 30 minute appointment is not until October 3rd please advise

## 2016-04-12 NOTE — Telephone Encounter (Signed)
You can  Schedule her in the next available follow up or in a 4 :15 apt spot. If she would like to come on a Tues that would be fine as well.    KP

## 2016-04-12 NOTE — Telephone Encounter (Signed)
-----   Message from Elliot Gault sent at 04/12/2016 10:48 AM EDT ----- I will call patient to schedule Pap just let me know any same days that would work better for Dr. Laury Axon

## 2016-04-12 NOTE — Telephone Encounter (Signed)
Ok to put in 15 min slot since only for pap

## 2016-04-15 NOTE — Telephone Encounter (Signed)
Appointment scheduled for 4pm.

## 2016-05-04 ENCOUNTER — Other Ambulatory Visit (HOSPITAL_COMMUNITY)
Admission: RE | Admit: 2016-05-04 | Discharge: 2016-05-04 | Disposition: A | Discharge status: Home / Self Care | Payer: BC Managed Care – PPO | Source: Ambulatory Visit | Attending: Family Medicine | Admitting: Family Medicine

## 2016-05-04 ENCOUNTER — Ambulatory Visit (INDEPENDENT_AMBULATORY_CARE_PROVIDER_SITE_OTHER): Payer: BC Managed Care – PPO | Admitting: Family Medicine

## 2016-05-04 ENCOUNTER — Encounter: Payer: Self-pay | Admitting: Family Medicine

## 2016-05-04 VITALS — BP 112/70 | HR 68 | Temp 98.4°F | Wt 141.6 lb

## 2016-05-04 DIAGNOSIS — Z113 Encounter for screening for infections with a predominantly sexual mode of transmission: Secondary | ICD-10-CM | POA: Diagnosis present

## 2016-05-04 DIAGNOSIS — Z124 Encounter for screening for malignant neoplasm of cervix: Secondary | ICD-10-CM | POA: Diagnosis not present

## 2016-05-04 DIAGNOSIS — Z01419 Encounter for gynecological examination (general) (routine) without abnormal findings: Secondary | ICD-10-CM | POA: Diagnosis present

## 2016-05-04 DIAGNOSIS — Z1151 Encounter for screening for human papillomavirus (HPV): Secondary | ICD-10-CM | POA: Diagnosis not present

## 2016-05-04 DIAGNOSIS — N760 Acute vaginitis: Secondary | ICD-10-CM | POA: Diagnosis present

## 2016-05-04 NOTE — Progress Notes (Signed)
Pre visit review using our clinic review tool, if applicable. No additional management support is needed unless otherwise documented below in the visit note. 

## 2016-05-04 NOTE — Progress Notes (Signed)
Subjective:     Erica DoeGabrielle Kaiser is a 25 y.o. woman who comes in today for a  pap smear only. Her most recent annual exam was several years ago. Her most recent Pap smear was on several years ago and showed unknown result but was abnormal. Previous abnormal Pap smears: no. Contraception: OCP (estrogen/progesterone)  The following portions of the patient's history were reviewed and updated as appropriate:  She  has no past medical history on file. She  does not have a problem list on file. She  has no past surgical history on file. Her family history includes Alcohol abuse in her maternal uncle and paternal uncle; Diabetes in her paternal grandmother; Drug abuse in her maternal uncle and paternal uncle; Hypertension in her father. She  reports that she has never smoked. She does not have any smokeless tobacco history on file. She reports that she drinks alcohol. She reports that she does not use drugs. She has a current medication list which includes the following prescription(s): ciprofloxacin and norgestimate-ethinyl estradiol. Current Outpatient Prescriptions on File Prior to Visit  Medication Sig Dispense Refill  . ciprofloxacin (CIPRO) 250 MG tablet Take 1 tablet (250 mg total) by mouth 2 (two) times daily. (Patient not taking: Reported on 05/04/2016) 6 tablet 0  . norgestimate-ethinyl estradiol (ORTHO-CYCLEN,SPRINTEC,PREVIFEM) 0.25-35 MG-MCG tablet Take 1 tablet by mouth daily. (Patient not taking: Reported on 05/04/2016) 1 Package 11   No current facility-administered medications on file prior to visit.    She has No Known Allergies..  Review of Systems Pertinent items are noted in HPI.   Objective:    BP 112/70 (BP Location: Right Arm, Patient Position: Sitting, Cuff Size: Normal)   Pulse 68   Temp 98.4 F (36.9 C) (Oral)   Wt 141 lb 9.6 oz (64.2 kg)   LMP 02/02/2016 (Approximate)   SpO2 99%   BMI 25.90 kg/m  Pelvic Exam: cervix normal in appearance, external genitalia normal,  vagina normal without discharge and pap done. Pap smear obtained.   Assessment:    Screening pap smear.   Plan:    Follow up in 1 year, or as indicated by Pap results.

## 2016-05-04 NOTE — Patient Instructions (Signed)
Pap Test WHY AM I HAVING THIS TEST? A pap test is sometimes called a pap smear. It is a screening test that is used to check for signs of cancer of the vagina, cervix, and uterus. The test can also identify the presence of infection or precancerous changes. Your health care provider will likely recommend you have this test done on a regular basis. This test may be done:  Every 3 years, starting at age 24.  Every 5 years, in combination with testing for the presence of human papillomavirus (HPV).  More or less often depending on other medical conditions.  WHAT KIND OF SAMPLE IS TAKEN? Using a small cotton swab, plastic spatula, or brush, your health care provider will collect a sample of cells from the surface of your cervix. Your cervix is the opening to your uterus, also called a womb. Secretions from the cervix and vagina may also be collected. HOW DO I PREPARE FOR THE TEST?  Be aware of where you are in your menstrual cycle. You may be asked to reschedule the test if you are menstruating on the day of the test.  You may need to reschedule if you have a known vaginal infection on the day of the test.  You may be asked to avoid douching or taking a bath the day before or the day of the test.  Some medicines can cause abnormal test results, such as digitalis and tetracycline. Talk with your health care provider before your test if you take one of these medicines. WHAT DO THE RESULTS MEAN? Abnormal test results may indicate a number of health conditions. These may include:  Cancer. Although pap test results cannot be used to diagnose cancer of the cervix, vagina, or uterus, they may suggest the possibility of cancer. Further tests would be required to determine if cancer is present.  Sexually transmitted disease.  Fungal infection.  Parasite infection.  Herpes infection.  A condition causing or contributing to infertility. It is your responsibility to obtain your test results. Ask  the lab or department performing the test when and how you will get your results. Contact your health care provider to discuss any questions you have about your results.   This information is not intended to replace advice given to you by your health care provider. Make sure you discuss any questions you have with your health care provider.   Document Released: 11/27/2002 Document Revised: 09/27/2014 Document Reviewed: 01/28/2014 Elsevier Interactive Patient Education 2016 Elsevier Inc.  

## 2016-05-07 LAB — CYTOLOGY - PAP

## 2016-05-10 ENCOUNTER — Other Ambulatory Visit: Payer: Self-pay

## 2016-05-10 LAB — CERVICOVAGINAL ANCILLARY ONLY: CANDIDA VAGINITIS: NEGATIVE

## 2016-05-10 MED ORDER — METRONIDAZOLE 500 MG PO TABS: 500.0000 mg | ORAL_TABLET | Freq: Two times a day (BID) | ORAL | 0 refills | Status: DC

## 2016-05-18 ENCOUNTER — Encounter: Payer: Self-pay | Admitting: Family Medicine

## 2016-11-16 ENCOUNTER — Ambulatory Visit (INDEPENDENT_AMBULATORY_CARE_PROVIDER_SITE_OTHER): Payer: BC Managed Care – PPO | Admitting: Family Medicine

## 2016-11-16 ENCOUNTER — Encounter: Payer: Self-pay | Admitting: Family Medicine

## 2016-11-16 VITALS — BP 102/82 | HR 70 | Temp 98.0°F | Resp 16 | Ht 62.0 in | Wt 137.6 lb

## 2016-11-16 DIAGNOSIS — J020 Streptococcal pharyngitis: Secondary | ICD-10-CM | POA: Diagnosis not present

## 2016-11-16 LAB — POCT RAPID STREP A (OFFICE): Rapid Strep A Screen: POSITIVE — AB

## 2016-11-16 MED ORDER — AMOXICILLIN 875 MG PO TABS: 875.0000 mg | ORAL_TABLET | Freq: Two times a day (BID) | ORAL | 0 refills | Status: DC

## 2016-11-16 NOTE — Progress Notes (Signed)
Pre visit review using our clinic review tool, if applicable. No additional management support is needed unless otherwise documented below in the visit note. 

## 2016-11-16 NOTE — Patient Instructions (Signed)
Strep Throat Strep throat is a bacterial infection of the throat. Your health care provider may call the infection tonsillitis or pharyngitis, depending on whether there is swelling in the tonsils or at the back of the throat. Strep throat is most common during the cold months of the year in children who are 5-25 years of age, but it can happen during any season in people of any age. This infection is spread from person to person (contagious) through coughing, sneezing, or close contact. What are the causes? Strep throat is caused by the bacteria called Streptococcus pyogenes. What increases the risk? This condition is more likely to develop in:  People who spend time in crowded places where the infection can spread easily.  People who have close contact with someone who has strep throat.  What are the signs or symptoms? Symptoms of this condition include:  Fever or chills.  Redness, swelling, or pain in the tonsils or throat.  Pain or difficulty when swallowing.  White or yellow spots on the tonsils or throat.  Swollen, tender glands in the neck or under the jaw.  Red rash all over the body (rare).  How is this diagnosed? This condition is diagnosed by performing a rapid strep test or by taking a swab of your throat (throat culture test). Results from a rapid strep test are usually ready in a few minutes, but throat culture test results are available after one or two days. How is this treated? This condition is treated with antibiotic medicine. Follow these instructions at home: Medicines  Take over-the-counter and prescription medicines only as told by your health care provider.  Take your antibiotic as told by your health care provider. Do not stop taking the antibiotic even if you start to feel better.  Have family members who also have a sore throat or fever tested for strep throat. They may need antibiotics if they have the strep infection. Eating and drinking  Do not  share food, drinking cups, or personal items that could cause the infection to spread to other people.  If swallowing is difficult, try eating soft foods until your sore throat feels better.  Drink enough fluid to keep your urine clear or pale yellow. General instructions  Gargle with a salt-water mixture 3-4 times per day or as needed. To make a salt-water mixture, completely dissolve -1 tsp of salt in 1 cup of warm water.  Make sure that all household members wash their hands well.  Get plenty of rest.  Stay home from school or work until you have been taking antibiotics for 24 hours.  Keep all follow-up visits as told by your health care provider. This is important. Contact a health care provider if:  The glands in your neck continue to get bigger.  You develop a rash, cough, or earache.  You cough up a thick liquid that is green, yellow-brown, or bloody.  You have pain or discomfort that does not get better with medicine.  Your problems seem to be getting worse rather than better.  You have a fever. Get help right away if:  You have new symptoms, such as vomiting, severe headache, stiff or painful neck, chest pain, or shortness of breath.  You have severe throat pain, drooling, or changes in your voice.  You have swelling of the neck, or the skin on the neck becomes red and tender.  You have signs of dehydration, such as fatigue, dry mouth, and decreased urination.  You become increasingly sleepy, or   you cannot wake up completely.  Your joints become red or painful. This information is not intended to replace advice given to you by your health care provider. Make sure you discuss any questions you have with your health care provider. Document Released: 09/03/2000 Document Revised: 05/05/2016 Document Reviewed: 12/30/2014 Elsevier Interactive Patient Education  2017 Elsevier Inc.  

## 2016-11-16 NOTE — Progress Notes (Signed)
Patient ID: Erica DoeGabrielle Kaiser, female    DOB: 03/16/1991  Age: 26 y.o. MRN: [id number]    Subjective:  Subjective  HPI Erica Kaiser presents for sore throat x 4 days    + low grade fever No cough or congestion She had the flu last week and finished tamiflu Review of Systems  Constitutional: Negative for appetite change, diaphoresis, fatigue and unexpected weight change.  HENT: Positive for sore throat.   Eyes: Negative for pain, redness and visual disturbance.  Respiratory: Negative for cough, chest tightness, shortness of breath and wheezing.   Cardiovascular: Negative for chest pain, palpitations and leg swelling.  Endocrine: Negative for cold intolerance, heat intolerance, polydipsia, polyphagia and polyuria.  Genitourinary: Negative for difficulty urinating, dysuria and frequency.  Neurological: Negative for dizziness, light-headedness, numbness and headaches.    History No past medical history on file.  She has no past surgical history on file.   Her family history includes Alcohol abuse in her maternal uncle and paternal uncle; Diabetes in her paternal aunt and paternal grandmother; Drug abuse in her maternal uncle and paternal uncle; Hypertension in her father.She reports that she has never smoked. She has never used smokeless tobacco. She reports that she drinks alcohol. She reports that she does not use drugs.  No current outpatient prescriptions on file prior to visit.   No current facility-administered medications on file prior to visit.      Objective:  Objective  Physical Exam  Constitutional: She is oriented to person, place, and time. She appears well-developed and well-nourished.  HENT:  Right Ear: External ear normal.  Left Ear: External ear normal.  Nose: Rhinorrhea present. Right sinus exhibits no maxillary sinus tenderness and no frontal sinus tenderness. Left sinus exhibits no maxillary sinus tenderness and no frontal sinus tenderness.  Mouth/Throat:  Posterior oropharyngeal erythema present. No oropharyngeal exudate or posterior oropharyngeal edema.  + PND + errythema  Eyes: Conjunctivae are normal. Right eye exhibits no discharge. Left eye exhibits no discharge.  Neck: Neck supple.  Cardiovascular: Normal rate, regular rhythm and normal heart sounds.   No murmur heard. Pulmonary/Chest: Effort normal and breath sounds normal. No respiratory distress. She has no wheezes. She has no rales. She exhibits no tenderness.  Musculoskeletal: She exhibits no edema.  Lymphadenopathy:    She has cervical adenopathy.  Neurological: She is alert and oriented to person, place, and time.  Nursing note and vitals reviewed.  BP 102/82 (BP Location: Right Arm, Patient Position: Sitting, Cuff Size: Normal)   Pulse 70   Temp 98 F (36.7 C) (Oral)   Resp 16   Ht 5\' 2"  (1.575 m)   Wt 137 lb 9.6 oz (62.4 kg)   SpO2 98%   BMI 25.17 kg/m  Wt Readings from Last 3 Encounters:  11/16/16 137 lb 9.6 oz (62.4 kg)  05/04/16 141 lb 9.6 oz (64.2 kg)  01/05/16 135 lb 3.2 oz (61.3 kg)     Lab Results  Component Value Date   WBC 3.7 (L) 01/05/2016   HGB 13.6 01/05/2016   HCT 40.8 01/05/2016   PLT 256.0 01/05/2016   GLUCOSE 78 01/05/2016   CHOL 133 01/05/2016   TRIG 65.0 01/05/2016   HDL 61.80 01/05/2016   LDLCALC 58 01/05/2016   ALT 16 01/05/2016   AST 20 01/05/2016   NA 138 01/05/2016   K 4.0 01/05/2016   CL 105 01/05/2016   CREATININE 0.91 01/05/2016   BUN 12 01/05/2016   CO2 29 01/05/2016   TSH 0.50  01/05/2016    No results found.   Assessment & Plan:  Plan  I have discontinued Ms. [place] norgestimate-ethinyl estradiol, ciprofloxacin, and metroNIDAZOLE. I am also having her start on amoxicillin.  Meds ordered this encounter  Medications  . amoxicillin (AMOXIL) 875 MG tablet    Sig: Take 1 tablet (875 mg total) by mouth 2 (two) times daily.    Dispense:  20 tablet    Refill:  0    Problem List Items Addressed This Visit    None     Visit Diagnoses    Strep throat    -  Primary   Relevant Medications   amoxicillin (AMOXIL) 875 MG tablet    ibuprofen for pain / fever Note for 2 days out of work rto prn   Follow-up: Return if symptoms worsen or fail to improve.  Donato Schultz, DO

## 2016-11-19 ENCOUNTER — Other Ambulatory Visit: Payer: BC Managed Care – PPO

## 2016-11-23 ENCOUNTER — Encounter: Payer: Self-pay | Admitting: Family Medicine

## 2016-11-23 ENCOUNTER — Other Ambulatory Visit (HOSPITAL_COMMUNITY)
Admission: RE | Admit: 2016-11-23 | Discharge: 2016-11-23 | Disposition: A | Discharge status: Home / Self Care | Payer: BC Managed Care – PPO | Source: Ambulatory Visit | Attending: Family Medicine | Admitting: Family Medicine

## 2016-11-23 ENCOUNTER — Ambulatory Visit (INDEPENDENT_AMBULATORY_CARE_PROVIDER_SITE_OTHER): Payer: BC Managed Care – PPO | Admitting: Family Medicine

## 2016-11-23 VITALS — BP 102/60 | HR 71 | Temp 98.2°F | Resp 16 | Ht 62.0 in | Wt 136.6 lb

## 2016-11-23 DIAGNOSIS — A7489 Other chlamydial diseases: Secondary | ICD-10-CM | POA: Insufficient documentation

## 2016-11-23 DIAGNOSIS — N912 Amenorrhea, unspecified: Secondary | ICD-10-CM | POA: Insufficient documentation

## 2016-11-23 NOTE — Progress Notes (Signed)
Patient ID: Erica Kaiser, female   DOB: [birth year], 26 y.o.   MRN: [id number]     Subjective:  I acted as a Neurosurgeon for Dr. Zola Kaiser.  Erica Kaiser, CMA   Patient ID: Erica Kaiser, female    DOB: 06-Jan-1991, 26 y.o.   MRN: [id number]  Chief Complaint  Patient presents with  . Menstrual Problem    last period was last year around spring time.     HPI  Patient is in today for menstrual problem.  Last period was last year around spring time.  Has had irregular periods since age 83.  Very concerned about having no cycle.    Patient Care Team: Erica Schultz, DO as PCP - General (Family Medicine) Erica Schultz, DO (Family Medicine)   No past medical history on file.  No past surgical history on file.  Family History  Problem Relation Age of Onset  . Alcohol abuse Paternal Uncle   . Alcohol abuse Maternal Uncle   . Drug abuse Paternal Uncle   . Drug abuse Maternal Uncle   . Hypertension Father   . Diabetes Paternal Grandmother   . Diabetes Paternal Aunt     Social History   Social History  . Marital status: Single    Spouse name: N/A  . Number of children: N/A  . Years of education: N/A   Occupational History  . [employer] student    Social History Main Topics  . Smoking status: Never Smoker  . Smokeless tobacco: Never Used  . Alcohol use 0.0 oz/week     Comment: occ  . Drug use: No  . Sexual activity: Not Currently    Partners: Male   Other Topics Concern  . Not on file   Social History Narrative   ** Merged History Encounter **       Exercises daily for 3-4 hours    Outpatient Medications Prior to Visit  Medication Sig Dispense Refill  . amoxicillin (AMOXIL) 875 MG tablet Take 1 tablet (875 mg total) by mouth 2 (two) times daily. 20 tablet 0   No facility-administered medications prior to visit.     No Known Allergies  Review of Systems  Constitutional: Negative for chills, fever and malaise/fatigue.  HENT: Negative for congestion and  hearing loss.   Eyes: Negative for discharge.  Respiratory: Negative for cough, sputum production and shortness of breath.   Cardiovascular: Negative for chest pain, palpitations and leg swelling.  Gastrointestinal: Negative for abdominal pain, blood in stool, constipation, diarrhea, heartburn, nausea and vomiting.  Genitourinary: Negative for dysuria, frequency, hematuria and urgency.  Musculoskeletal: Negative for back pain, falls and myalgias.  Skin: Negative for rash.  Neurological: Negative for dizziness, sensory change, loss of consciousness, weakness and headaches.  Endo/Heme/Allergies: Negative for environmental allergies. Does not bruise/bleed easily.  Psychiatric/Behavioral: Negative for depression and suicidal ideas. The patient is not nervous/anxious and does not have insomnia.        Objective:    Physical Exam  Constitutional: She is oriented to person, place, and time. She appears well-developed and well-nourished.  HENT:  Head: Normocephalic and atraumatic.  Eyes: Conjunctivae and EOM are normal.  Neck: Normal range of motion. Neck supple. No JVD present. Carotid bruit is not present. No thyromegaly present.  Cardiovascular: Normal rate, regular rhythm and normal heart sounds.   No murmur heard. Pulmonary/Chest: Effort normal and breath sounds normal. No respiratory distress. She has no wheezes. She has no rales. She exhibits no tenderness.  Musculoskeletal:  She exhibits no edema.  Neurological: She is alert and oriented to person, place, and time.  Psychiatric: She has a normal mood and affect.  Nursing note and vitals reviewed.   BP 102/60 (BP Location: Left Arm, Cuff Size: Normal)   Pulse 71   Temp 98.2 F (36.8 C) (Oral)   Resp 16   Ht 5\' 2"  (1.575 m)   Wt 136 lb 9.6 oz (62 kg)   SpO2 98%   BMI 24.98 kg/m  Wt Readings from Last 3 Encounters:  11/23/16 136 lb 9.6 oz (62 kg)  11/16/16 137 lb 9.6 oz (62.4 kg)  05/04/16 141 lb 9.6 oz (64.2 kg)     Lab  Results  Component Value Date   WBC 4.8 11/23/2016   HGB 13.6 11/23/2016   HCT 41.0 11/23/2016   PLT 322.0 11/23/2016   GLUCOSE 74 11/23/2016   CHOL 133 01/05/2016   TRIG 65.0 01/05/2016   HDL 61.80 01/05/2016   LDLCALC 58 01/05/2016   ALT 15 11/23/2016   AST 19 11/23/2016   NA 137 11/23/2016   K 4.2 11/23/2016   CL 102 11/23/2016   CREATININE 0.90 11/23/2016   BUN 10 11/23/2016   CO2 29 11/23/2016   TSH 0.50 01/05/2016    Lab Results  Component Value Date   TSH 0.50 01/05/2016   Lab Results  Component Value Date   WBC 4.8 11/23/2016   HGB 13.6 11/23/2016   HCT 41.0 11/23/2016   MCV 84.0 11/23/2016   PLT 322.0 11/23/2016   Lab Results  Component Value Date   NA 137 11/23/2016   K 4.2 11/23/2016   CO2 29 11/23/2016   GLUCOSE 74 11/23/2016   BUN 10 11/23/2016   CREATININE 0.90 11/23/2016   BILITOT 0.3 11/23/2016   ALKPHOS 58 11/23/2016   AST 19 11/23/2016   ALT 15 11/23/2016   PROT 7.9 11/23/2016   ALBUMIN 4.1 11/23/2016   CALCIUM 9.6 11/23/2016   GFR 98.76 11/23/2016   Lab Results  Component Value Date   CHOL 133 01/05/2016   Lab Results  Component Value Date   HDL 61.80 01/05/2016   Lab Results  Component Value Date   LDLCALC 58 01/05/2016   Lab Results  Component Value Date   TRIG 65.0 01/05/2016   Lab Results  Component Value Date   CHOLHDL 2 01/05/2016   No results found for: HGBA1C     Assessment & Plan:   Problem List Items Addressed This Visit    None    Visit Diagnoses    Amenorrhea    -  Primary   Relevant Orders   Ambulatory referral to Gynecology   POCT urine pregnancy   Urine cytology ancillary only   Comprehensive metabolic panel (Completed)   CBC with Differential/Platelet (Completed)   Thyroid Panel With TSH      I have discontinued Ms. Erica Kaiser's amoxicillin.  No orders of the defined types were placed in this encounter.   CMA served as Neurosurgeonscribe during this visit. History, Physical and Plan performed by medical  provider. Documentation and orders reviewed and attested to.  Erica SchultzYvonne R Lowne Chase, DO

## 2016-11-23 NOTE — Patient Instructions (Signed)
Primary Amenorrhea Primary amenorrhea is the absence of any menstrual flow in a female by the age of 15 years. An average age for the start of menstruation is the age of 12 years. Primary amenorrhea is not considered to have occurred until a female is older than 15 years and has never menstruated. This may occur with or without other signs of puberty. What are the causes? Some common causes of not menstruating include:  Chromosomal abnormality causing the ovaries to malfunction is the most common cause of primary amenorrhea.  Malnutrition.  Low blood sugar (hypoglycemia).  Polycystic ovary syndrome (cysts in the ovaries, not ovulating).  Absence of the vagina, uterus, or ovaries since birth (congenital).  Extreme obesity.  Cystic fibrosis.  Drastic weight loss from any cause.  Over-exercising (running, biking) causing loss of body fat.  Pituitary gland tumor in the brain.  Long-term (chronic) illnesses.  Cushing disease.  Thyroid disease (hypothyroidism, hyperthyroidism).  Part of the brain (hypothalamus) not functioning normally.  Premature ovarian failure. What are the signs or symptoms? No menstruation by age 15 years in normally developed females is the primary symptom. Other symptoms may include:  Discharge from the breasts.  Hot flashes.  Adult acne.  Facial or chest hair.  Headaches.  Impaired vision.  Recent stress.  Changes in weight, diet, or exercise patterns. How is this diagnosed? Primary amenorrhea is diagnosed with the help of a medical history and a physical exam. Other tests that may be recommended include:  Blood tests to check for pregnancy, hormonal changes, a bleeding or thyroid disorder, low iron levels (anemia), or other problems.  Urine tests.  Specialized X-ray exams. How is this treated? Treatment will depend on the cause. For example, some of the causes of primary amenorrhea, such as congenital absence of sex organs, will require  surgery to correct. Others may respond to treatment with medicine. Contact a health care provider if:  There has not been any menstrual flow by age 15 years.  Body maturation does not occur at a level typical of peers.  Pelvic area pain occurs.  There is unusual weight gain or hair growth. This information is not intended to replace advice given to you by your health care provider. Make sure you discuss any questions you have with your health care provider. Document Released: 09/06/2005 Document Revised: 02/12/2016 Document Reviewed: 04/18/2013 Elsevier Interactive Patient Education  2017 Elsevier Inc.  

## 2016-11-23 NOTE — Progress Notes (Signed)
Pre visit review using our clinic review tool, if applicable. No additional management support is needed unless otherwise documented below in the visit note. 

## 2016-11-24 LAB — COMPREHENSIVE METABOLIC PANEL
ALK PHOS: 58 U/L (ref 39–117)
ALT: 15 U/L (ref 0–35)
AST: 19 U/L (ref 0–37)
Albumin: 4.1 g/dL (ref 3.5–5.2)
BILIRUBIN TOTAL: 0.3 mg/dL (ref 0.2–1.2)
BUN: 10 mg/dL (ref 6–23)
CO2: 29 meq/L (ref 19–32)
Calcium: 9.6 mg/dL (ref 8.4–10.5)
Chloride: 102 mEq/L (ref 96–112)
Creatinine, Ser: 0.9 mg/dL (ref 0.40–1.20)
GFR: 98.76 mL/min (ref >60.00)
GLUCOSE: 74 mg/dL (ref 70–99)
POTASSIUM: 4.2 meq/L (ref 3.5–5.1)
SODIUM: 137 meq/L (ref 135–145)
TOTAL PROTEIN: 7.9 g/dL (ref 6.0–8.3)

## 2016-11-24 LAB — CBC WITH DIFFERENTIAL/PLATELET
Basophils Absolute: 0.1 10*3/uL (ref 0.0–0.1)
Basophils Relative: 1.1 % (ref 0.0–3.0)
EOS PCT: 2.1 % (ref 0.0–5.0)
Eosinophils Absolute: 0.1 10*3/uL (ref 0.0–0.7)
HCT: 41 % (ref 36.0–46.0)
Hemoglobin: 13.6 g/dL (ref 12.0–15.0)
LYMPHS ABS: 2.6 10*3/uL (ref 0.7–4.0)
Lymphocytes Relative: 53.4 % — ABNORMAL HIGH (ref 12.0–46.0)
MCHC: 33.2 g/dL (ref 30.0–36.0)
MCV: 84 fl (ref 78.0–100.0)
MONO ABS: 0.5 10*3/uL (ref 0.1–1.0)
MONOS PCT: 9.4 % (ref 3.0–12.0)
NEUTROS ABS: 1.6 10*3/uL (ref 1.4–7.7)
NEUTROS PCT: 34 % — AB (ref 43.0–77.0)
PLATELETS: 322 10*3/uL (ref 150.0–400.0)
RBC: 4.88 Mil/uL (ref 3.87–5.11)
RDW: 12.9 % (ref 11.5–15.5)
WBC: 4.8 10*3/uL (ref 4.0–10.5)

## 2016-11-25 LAB — THYROID PANEL WITH TSH
Free Thyroxine Index: 2.8 (ref 1.4–3.8)
T3 Uptake: 29 % (ref 22–35)
T4 TOTAL: 9.6 ug/dL (ref 4.5–12.0)
TSH: 0.65 m[IU]/L

## 2016-11-25 LAB — URINE CYTOLOGY ANCILLARY ONLY
CHLAMYDIA, DNA PROBE: POSITIVE — AB
NEISSERIA GONORRHEA: NEGATIVE
TRICH (WINDOWPATH): NEGATIVE

## 2016-11-26 ENCOUNTER — Telehealth: Payer: Self-pay | Admitting: *Deleted

## 2016-11-26 MED ORDER — AZITHROMYCIN 250 MG PO TABS: 1000.0000 mg | ORAL_TABLET | Freq: Once | ORAL | 0 refills | Status: AC

## 2016-11-26 NOTE — Telephone Encounter (Signed)
rx sent in for chlamydia

## 2016-11-29 LAB — URINE CYTOLOGY ANCILLARY ONLY
Bacterial vaginitis: POSITIVE — AB
Candida vaginitis: NEGATIVE

## 2016-11-30 ENCOUNTER — Other Ambulatory Visit: Payer: Self-pay | Admitting: Family Medicine

## 2016-11-30 MED ORDER — METRONIDAZOLE 500 MG PO TABS: 500.0000 mg | ORAL_TABLET | Freq: Two times a day (BID) | ORAL | 0 refills | Status: DC

## 2016-11-30 NOTE — Telephone Encounter (Signed)
Flagyl sent to the pharmacy

## 2017-04-07 ENCOUNTER — Emergency Department (HOSPITAL_BASED_OUTPATIENT_CLINIC_OR_DEPARTMENT_OTHER)
Admission: EM | Admit: 2017-04-07 | Discharge: 2017-04-08 | Disposition: A | Discharge status: Home / Self Care | Payer: BC Managed Care – PPO | Attending: Emergency Medicine | Admitting: Emergency Medicine

## 2017-04-07 ENCOUNTER — Encounter (HOSPITAL_BASED_OUTPATIENT_CLINIC_OR_DEPARTMENT_OTHER): Payer: Self-pay

## 2017-04-07 DIAGNOSIS — J069 Acute upper respiratory infection, unspecified: Secondary | ICD-10-CM | POA: Diagnosis not present

## 2017-04-07 DIAGNOSIS — R05 Cough: Secondary | ICD-10-CM | POA: Insufficient documentation

## 2017-04-07 DIAGNOSIS — J029 Acute pharyngitis, unspecified: Secondary | ICD-10-CM | POA: Diagnosis present

## 2017-04-07 DIAGNOSIS — B9789 Other viral agents as the cause of diseases classified elsewhere: Secondary | ICD-10-CM

## 2017-04-07 LAB — RAPID STREP SCREEN (MED CTR MEBANE ONLY): Streptococcus, Group A Screen (Direct): NEGATIVE

## 2017-04-07 MED ORDER — LIDOCAINE VISCOUS 2 % MT SOLN: 15.0000 mL | OROMUCOSAL | 0 refills | Status: DC | PRN

## 2017-04-07 NOTE — Discharge Instructions (Signed)
Your strep screen was negative this evening. A throat culture was sent as a precaution and results will be available in 2-3 days. If it returns positive for strep, you will be called by our flow manager for further instructions. However, at this time, it appears that your child's sore throat is caused by a viral infection. Antibiotics do NOT help a viral infection and can cause unwanted side effects. You may take ibuprofen every 6hr as needed for throat pain. In addition, please take mucinex to help with your post nasal drip that may be contributing to your symptoms. Use the viscous lidocaine for mouth pain. Swish with the lidocaine and spit it out. Do not swallow it. Follow up with your doctor in 2-3 days. Return sooner for worsening symptoms, inability to swallow, breathing difficulty, new concerns.

## 2017-04-07 NOTE — ED Triage Notes (Signed)
C/o sore throat x 2-3 days-NAD-steady gait 

## 2017-04-07 NOTE — ED Notes (Signed)
ED Provider at bedside. 

## 2017-04-07 NOTE — ED Provider Notes (Signed)
[facility] Provider Note   CSN: [id number] Arrival date & time: 04/07/17  2156   By signing my name below, I, Clarisse Gouge, attest that this documentation has been prepared under the direction and in the presence of Dione Booze, MD. Electronically signed, Clarisse Gouge, ED Scribe. 04/07/17. 11:52 PM.  History   Chief Complaint Chief Complaint  Patient presents with  . Sore Throat   The history is provided by the patient and medical records. No language interpreter was used.    Erica Kaiser is a 26 y.o. female presenting to the Emergency Department concerning gradually worsening sore throat x 2-3 days. She states she initially had a sinus infection and cough ~1 week ago which she was treated with Amoxicillin for. She has had postnasal drip at night since and today began experiencing significantly worsened throat pain from initial severity (3 days ago). She states her pain is worse with swallowing. She has taken Advil cold and sinus without relief. . No difficulty swallowing or speaking; no drooling. No voice change. No fevers or rash. No ear pain. No recent dental work. No other complaints at this time.  History reviewed. No pertinent past medical history.  There are no active problems to display for this patient.   History reviewed. No pertinent surgical history.  OB History    No data available       Home Medications    Prior to Admission medications   Medication Sig Start Date End Date Taking? Authorizing Provider  lidocaine (XYLOCAINE) 2 % solution Use as directed 15 mLs in the mouth or throat as needed for mouth pain. 04/07/17   Domnique Vantine, Elmer Sow, PA-C    Family History Family History  Problem Relation Age of Onset  . Alcohol abuse Paternal Uncle   . Alcohol abuse Maternal Uncle   . Drug abuse Paternal Uncle   . Drug abuse Maternal Uncle   . Hypertension Father   . Diabetes Paternal Grandmother   . Diabetes Paternal Aunt     Social  History Social History  Substance Use Topics  . Smoking status: Never Smoker  . Smokeless tobacco: Never Used  . Alcohol use 0.0 oz/week     Comment: occ     Allergies   Patient has no known allergies.   Review of Systems Review of Systems  Constitutional: Negative for fever.  HENT: Positive for postnasal drip, sinus pain, sinus pressure and sore throat. Negative for drooling, ear pain, facial swelling, rhinorrhea, trouble swallowing and voice change.   Respiratory: Negative for shortness of breath.   Skin: Negative for color change and rash.  All other systems reviewed and are negative.    Physical Exam Updated Vital Signs BP 115/79 (BP Location: Left Arm)   Pulse 72   Temp 98.5 F (36.9 C) (Oral)   Resp 18   Wt 140 lb 6.4 oz (63.7 kg)   LMP 03/04/2017   SpO2 100%   BMI 25.68 kg/m   Physical Exam  Constitutional: She appears well-developed and well-nourished.  Non-toxic appearing  HENT:  Head: Normocephalic and atraumatic.  Right Ear: Hearing, tympanic membrane, external ear and ear canal normal.  Left Ear: Hearing, tympanic membrane, external ear and ear canal normal.  Nose: Right sinus exhibits no maxillary sinus tenderness and no frontal sinus tenderness. Left sinus exhibits no maxillary sinus tenderness and no frontal sinus tenderness.  Mouth/Throat: Uvula is midline. No tonsillar exudate.  Boggy turbinates on the R sideThe patient has normal phonation and is  in control of secretions. No trismus. No stridor to indicate FB. Midline uvula, no tonsillar erythema or exudates. Cobblestoning and mild injection noted in the posterior pharynx.Mild injection noted. No creptius on neck palpation and patient has good dentition   Eyes: Conjunctivae are normal. Right eye exhibits no discharge. Left eye exhibits no discharge. No scleral icterus.  Neck: Normal range of motion and full passive range of motion without pain. Neck supple. No spinous process tenderness present. No  neck rigidity. Normal range of motion present.  Pulmonary/Chest: Effort normal. No respiratory distress.  Lymphadenopathy:    She has no cervical adenopathy.  Neurological: She is alert.  Skin: Skin is warm. No rash noted. No pallor.  Psychiatric: She has a normal mood and affect.  Nursing note and vitals reviewed.    ED Treatments / Results  DIAGNOSTIC STUDIES: Oxygen Saturation is 100% on RA, NL by my interpretation.    COORDINATION OF CARE: 11:31 PM-Discussed next steps with pt. Pt verbalized understanding and is agreeable with the plan. Will Rx lidocaine mouthwash. Pt prepared for d/c, advised of symptomatic care at home, F/U instructions and return precautions.   Labs (all labs ordered are listed, but only abnormal results are displayed) Labs Reviewed  RAPID STREP [facility]CREEN (NOT AT [facility])  CULTURE, GROUP A STREP [facility])    EKG  EKG Interpretation None       Radiology No results found.  Procedures Procedures (including critical care time)  Medications Ordered in ED Medications - No data to display   Initial Impression / Assessment and Plan / ED Course  I have reviewed the triage vital signs and the nursing notes.  Pertinent labs & imaging results that were available during my care of the patient were reviewed by me and considered in my medical decision making (see chart for details).     26 year old presenting with sore throat following recent sinus infection. Patient is afebrile, non-toxic appearing and there is no respiratory distress.The patient has normal phonation and is in control of secretions which makes me believe this is not epiglottitis. No stridor to indicate FB. Midline uvula, no exudates which makes PTA less concerning.  No creptius on neck palpation and patient has good dentition which makes ludwigs unlikely. Denies oral sex, do not suspect from G/C. Strep test negative. On exam there is mild boggy turbinates, rhinorrhea and cobblestoning of the  posterior pharynx. Suspect source of the patients sore throat is from PND, as she reports this at night.  Patients symptoms are consistent with URI, likely viral etiology. Discussed that antibiotics are not indicated for viral infections. Pt will be discharged with symptomatic treatment.  I advised the patient to follow-up with their primary care provider this week. I advised the patient to return to the emergency department with new or worsening symptoms or new concerns. Specific return precautions discussed. The patient verbalized understanding and agreement with plan. All questions answered. No further questions at this time. Pt is hemodynamically stable & in NAD prior to dc.    Final Clinical Impressions(s) / ED Diagnoses   Final diagnoses:  Viral URI with cough    New Prescriptions Discharge Medication List as of 04/07/2017 11:51 PM    START taking these medications   Details  lidocaine (XYLOCAINE) 2 % solution Use as directed 15 mLs in the mouth or throat as needed for mouth pain., Starting Thu 04/07/2017, Print      I personally performed the services described in this documentation, which was scribed  in my presence. The recorded information has been reviewed and is accurate.      Princella Pellegrini 04/08/17 0232    Dione Booze, MD 04/11/17 2242

## 2017-04-10 LAB — CULTURE, GROUP A STREP (THRC)

## 2017-05-09 ENCOUNTER — Encounter: Payer: Self-pay | Admitting: Family Medicine

## 2017-05-09 ENCOUNTER — Ambulatory Visit (INDEPENDENT_AMBULATORY_CARE_PROVIDER_SITE_OTHER): Payer: BC Managed Care – PPO | Admitting: Family Medicine

## 2017-05-09 VITALS — BP 98/62 | HR 74 | Temp 98.3°F | Ht 62.0 in | Wt 139.4 lb

## 2017-05-09 DIAGNOSIS — M545 Low back pain, unspecified: Secondary | ICD-10-CM

## 2017-05-09 MED ORDER — CYCLOBENZAPRINE HCL 5 MG PO TABS: 5.0000 mg | ORAL_TABLET | Freq: Three times a day (TID) | ORAL | 0 refills | Status: DC | PRN

## 2017-05-09 MED ORDER — METHYLPREDNISOLONE 4 MG PO TBPK: ORAL_TABLET | ORAL | 0 refills | Status: DC

## 2017-05-09 MED ORDER — KETOROLAC TROMETHAMINE 60 MG/2ML IM SOLN
60.0000 mg | Freq: Once | INTRAMUSCULAR | Status: AC
  Administered 2017-05-09: 60 mg via INTRAMUSCULAR

## 2017-05-09 NOTE — Progress Notes (Signed)
Musculoskeletal Exam  Patient: Erica Kaiser DOB: 02-11-1991  DOS: 05/09/2017  SUBJECTIVE:  Chief Complaint:   Chief Complaint  Patient presents with  . Back Pain  . Hip Pain    Erica Kaiser is a 26 y.o.  female for evaluation and treatment of back/hip pain.   Onset:  1 day ago. Associated with running up a hill yesterday. She does not stretch routinely  Location: LL back Character:  aching- tough to describe  Laying on her back makes it better, bending over and walking makes it worse. Progression of issue:  is unchanged Associated symptoms: None Treatment: to date has been OTC NSAIDS and Icy/Hot.   Neurovascular symptoms: no  ROS: Musculoskeletal/Extremities: +low back pain Neurologic: no numbness, tingling no weakness   Past Medical History:  Diagnosis Date  . No known health problems    Past Surgical History:  Procedure Laterality Date  . NO PAST SURGERIES     Family History  Problem Relation Age of Onset  . Alcohol abuse Paternal Uncle   . Alcohol abuse Maternal Uncle   . Drug abuse Paternal Uncle   . Drug abuse Maternal Uncle   . Hypertension Father   . Diabetes Paternal Grandmother   . Diabetes Paternal Aunt     No Known Allergies   Objective: VITAL SIGNS: BP 98/62 (BP Location: Left Arm, Patient Position: Sitting, Cuff Size: Normal)   Pulse 74   Temp 98.3 F (36.8 C) (Oral)   Ht 5\' 2"  (1.575 m)   Wt 139 lb 6 oz (63.2 kg)   SpO2 99%   BMI 25.49 kg/m  Constitutional: Well formed, well developed. No acute distress. Cardiovascular: Brisk cap refill Thorax & Lungs: No accessory muscle use Extremities: No clubbing. No cyanosis. No edema.  Skin: Warm. Dry. No erythema. No rash.  Musculoskeletal: Low back.   Tenderness to palpation: no Pain reproduced with resisted side bending to L Deformity: no Ecchymosis: no Tests positive: none Tests negative: Straight leg, Lesegue's Neurologic: Normal sensory function. No focal deficits noted. DTR's  equal and symmetry in LE's. No clonus. Psychiatric: Normal mood. Age appropriate judgment and insight. Alert & oriented x 3.    Assessment:  Acute left-sided low back pain without sciatica - Plan: cyclobenzaprine (FLEXERIL) 5 MG tablet, methylPREDNISolone (MEDROL DOSEPAK) 4 MG TBPK tablet, ketorolac (TORADOL) injection 60 mg  Plan: Orders as above. Likely strained ligament or hip flexor. She knows she needs to stretch more routinely. 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 F/u prn. The patient voiced understanding and agreement to the plan.   Erica Kaiser [place], DO 05/09/17  2:54 PM

## 2017-05-09 NOTE — Patient Instructions (Signed)
Get back to stretching. This will resolve with time, but I think stretching can help.  Let us know if you need anything.

## 2017-05-09 NOTE — Progress Notes (Signed)
Pre visit review using our clinic review tool, if applicable. No additional management support is needed unless otherwise documented below in the visit note. 

## 2017-12-23 ENCOUNTER — Ambulatory Visit: Payer: BC Managed Care – PPO | Admitting: Nurse Practitioner

## 2017-12-23 ENCOUNTER — Encounter: Payer: Self-pay | Admitting: Nurse Practitioner

## 2017-12-23 VITALS — BP 102/68 | HR 76 | Temp 98.0°F | Ht 62.0 in | Wt 143.8 lb

## 2017-12-23 DIAGNOSIS — J0140 Acute pansinusitis, unspecified: Secondary | ICD-10-CM

## 2017-12-23 MED ORDER — SALINE SPRAY 0.65 % NA SOLN
1.0000 | NASAL | 0 refills | Status: DC | PRN
Start: 2017-12-23 — End: 2018-02-22

## 2017-12-23 MED ORDER — FLUTICASONE PROPIONATE 50 MCG/ACT NA SUSP
2.0000 | Freq: Every day | NASAL | 0 refills | Status: DC
Start: 2017-12-23 — End: 2018-01-21

## 2017-12-23 MED ORDER — DM-GUAIFENESIN ER 30-600 MG PO TB12: 1.0000 | ORAL_TABLET | Freq: Two times a day (BID) | ORAL | 0 refills | Status: DC | PRN

## 2017-12-23 MED ORDER — CEFUROXIME AXETIL 500 MG PO TABS: 500.0000 mg | ORAL_TABLET | Freq: Two times a day (BID) | ORAL | 0 refills | Status: DC

## 2017-12-23 MED ORDER — GUAIFENESIN ER 600 MG PO TB12: 600.0000 mg | ORAL_TABLET | Freq: Two times a day (BID) | ORAL | 0 refills | Status: DC | PRN

## 2017-12-23 NOTE — Progress Notes (Signed)
Subjective:  Patient ID: Leeta Grimme, female    DOB: December 11, 1990  Age: 27 y.o. MRN: [id number]  CC: Sore Throat (dry bloody nose, head congestion.no cough.)   Sinus Problem  This is a new problem. The current episode started 1 to 4 weeks ago. The problem has been waxing and waning since onset. Associated symptoms include chills, congestion, coughing, headaches, sinus pressure, a sore throat and swollen glands. Pertinent negatives include no diaphoresis, ear pain, hoarse voice or shortness of breath. Past treatments include oral decongestants and acetaminophen. The treatment provided no relief.   Outpatient Medications Prior to Visit  Medication Sig Dispense Refill  . cyclobenzaprine (FLEXERIL) 5 MG tablet Take 1 tablet (5 mg total) by mouth 3 (three) times daily as needed for muscle spasms. (Patient not taking: Reported on 12/23/2017) 30 tablet 0  . methylPREDNISolone (MEDROL DOSEPAK) 4 MG TBPK tablet Follow instructions on package. (Patient not taking: Reported on 12/23/2017) 21 tablet 0   No facility-administered medications prior to visit.     ROS See HPI  Objective:  BP 102/68 (BP Location: Right Arm, Patient Position: Sitting, Cuff Size: Normal)   Pulse 76   Temp 98 F (36.7 C) (Oral)   Ht 5\' 2"  (1.575 m)   Wt 143 lb 12.8 oz (65.2 kg)   SpO2 99%   BMI 26.30 kg/m   BP Readings from Last 3 Encounters:  12/23/17 102/68  05/09/17 98/62  04/07/17 115/79    Wt Readings from Last 3 Encounters:  12/23/17 143 lb 12.8 oz (65.2 kg)  05/09/17 139 lb 6 oz (63.2 kg)  04/07/17 140 lb 6.4 oz (63.7 kg)    Physical Exam  Constitutional: She is oriented to person, place, and time.  HENT:  Right Ear: Tympanic membrane, external ear and ear canal normal.  Left Ear: Tympanic membrane, external ear and ear canal normal.  Nose: Mucosal edema and rhinorrhea present. Right sinus exhibits maxillary sinus tenderness. Right sinus exhibits no frontal sinus tenderness. Left sinus exhibits  maxillary sinus tenderness. Left sinus exhibits no frontal sinus tenderness.  Mouth/Throat: Uvula is midline. No trismus in the jaw. Posterior oropharyngeal erythema present. No oropharyngeal exudate.  Eyes: No scleral icterus.  Neck: Normal range of motion. Neck supple.  Cardiovascular: Normal rate and normal heart sounds.  Pulmonary/Chest: Effort normal and breath sounds normal.  Musculoskeletal: She exhibits no edema.  Lymphadenopathy:    She has cervical adenopathy.  Neurological: She is alert and oriented to person, place, and time.  Vitals reviewed.   Lab Results  Component Value Date   WBC 4.8 11/23/2016   HGB 13.6 11/23/2016   HCT 41.0 11/23/2016   PLT 322.0 11/23/2016   GLUCOSE 74 11/23/2016   CHOL 133 01/05/2016   TRIG 65.0 01/05/2016   HDL 61.80 01/05/2016   LDLCALC 58 01/05/2016   ALT 15 11/23/2016   AST 19 11/23/2016   NA 137 11/23/2016   K 4.2 11/23/2016   CL 102 11/23/2016   CREATININE 0.90 11/23/2016   BUN 10 11/23/2016   CO2 29 11/23/2016   TSH 0.65 11/23/2016    No results found.  Assessment & Plan:   Ameya was seen today for sore throat.  Diagnoses and all orders for this visit:  Acute non-recurrent pansinusitis -     sodium chloride (OCEAN) 0.65 % SOLN nasal spray; Place 1 spray into both nostrils as needed for congestion. -     dextromethorphan-guaiFENesin (MUCINEX DM) 30-600 MG 12hr tablet; Take 1 tablet by mouth 2 (  two) times daily as needed for cough. -     fluticasone (FLONASE) 50 MCG/ACT nasal spray; Place 2 sprays into both nostrils daily. -     cefUROXime (CEFTIN) 500 MG tablet; Take 1 tablet (500 mg total) by mouth 2 (two) times daily with a meal.   I am having Jacquiline DoeGabrielle Feeley start on sodium chloride, dextromethorphan-guaiFENesin, fluticasone, and cefUROXime. I am also having her maintain her cyclobenzaprine and methylPREDNISolone.  Meds ordered this encounter  Medications  . sodium chloride (OCEAN) 0.65 % SOLN nasal spray     Sig: Place 1 spray into both nostrils as needed for congestion.    Dispense:  15 mL    Refill:  0    Order Specific Question:   Supervising Provider    Answer:   Dianne DunARON, TALIA M [[id number]]  . dextromethorphan-guaiFENesin (MUCINEX DM) 30-600 MG 12hr tablet    Sig: Take 1 tablet by mouth 2 (two) times daily as needed for cough.    Dispense:  14 tablet    Refill:  0    Order Specific Question:   Supervising Provider    Answer:   Dianne DunARON, TALIA M [[id number]]  . fluticasone (FLONASE) 50 MCG/ACT nasal spray    Sig: Place 2 sprays into both nostrils daily.    Dispense:  16 g    Refill:  0    Order Specific Question:   Supervising Provider    Answer:   Dianne DunARON, TALIA M [[id number]]  . cefUROXime (CEFTIN) 500 MG tablet    Sig: Take 1 tablet (500 mg total) by mouth 2 (two) times daily with a meal.    Dispense:  10 tablet    Refill:  0    Order Specific Question:   Supervising Provider    Answer:   Dianne DunARON, TALIA M [[id number]]    Follow-up: No follow-ups on file.  Alysia Pennaharlotte Jazzy Parmer, NP

## 2017-12-23 NOTE — Patient Instructions (Signed)
Stop decongestants  Push oral hydration.

## 2018-01-21 ENCOUNTER — Other Ambulatory Visit: Payer: Self-pay | Admitting: Nurse Practitioner

## 2018-01-21 DIAGNOSIS — J0140 Acute pansinusitis, unspecified: Secondary | ICD-10-CM

## 2018-02-22 ENCOUNTER — Ambulatory Visit: Payer: BC Managed Care – PPO | Admitting: Medical

## 2018-02-22 ENCOUNTER — Telehealth: Payer: Self-pay

## 2018-02-22 DIAGNOSIS — Z0289 Encounter for other administrative examinations: Secondary | ICD-10-CM

## 2018-02-22 NOTE — Telephone Encounter (Signed)
Patient arrived late and was told by Edward's MA to reschedule. Author reached out to patient to apologize and urge her to go to urgent care to be assessed. Pt. stated she would do that today. Appointment with Dr. Carmelia RollerWendling for 6/6 pending.

## 2018-02-22 NOTE — Telephone Encounter (Signed)
PEC notified author of pt's MyChart complaint regarding chest pain from last night 6/4. Pt. self-scheduled an appointment with Dr. Laury AxonLowne for 6/10. Author phoned pt. for triage assessment. Pt c/o lower L sided intermittent short stabbing pain X 1 month. Pt denies sob, fever, chills, N/V. Pt. States she sometimes has "weird feeling in her throat", reflux-like symptoms X 2 weeks. Pt states she has been eating healthier, fewer spicy foods. Author consulted Efraim KaufmannMelissa, NP, provider of the day, and Melissa advised for pt. To be seen by provider today. Appointment made with Esperanza RichtersEdward Saguier, PA for 220PM today. Awaiting pt. Arrival.

## 2018-02-22 NOTE — Telephone Encounter (Signed)
Per jasmine Irving Burtonmily [facility] It appeared she showed up very late for her appointment. I believe about close to an hour. I had patient already scheduled.

## 2018-02-23 ENCOUNTER — Encounter: Payer: Self-pay | Admitting: Family Medicine

## 2018-02-23 ENCOUNTER — Ambulatory Visit: Payer: BC Managed Care – PPO | Admitting: Family Medicine

## 2018-02-23 VITALS — BP 110/70 | HR 76 | Temp 98.4°F | Ht 62.0 in | Wt 143.1 lb

## 2018-02-23 DIAGNOSIS — M940 Chondrocostal junction syndrome [Tietze]: Secondary | ICD-10-CM | POA: Diagnosis not present

## 2018-02-23 DIAGNOSIS — K219 Gastro-esophageal reflux disease without esophagitis: Secondary | ICD-10-CM

## 2018-02-23 NOTE — Progress Notes (Signed)
Pre visit review using our clinic review tool, if applicable. No additional management support is needed unless otherwise documented below in the visit note. 

## 2018-02-23 NOTE — Progress Notes (Signed)
Chief Complaint  Patient presents with  . Gastroesophageal Reflux  . Chest Pain    Erica DoeGabrielle Kaiser is a 27 y.o. female here for evaluation of chest pain.  Duration of issue: 1 month Quality: sharp Palliation: None Provocation: none Severity: 3/10 Radiation: none  Duration of chest pain: 1 minute Associated symptoms: Has been coughing Cardiac history: none Family heart history: HTN Smoker? No  She was seen in the urgent care yesterday and had a normal EKG and chest x-ray. She denies recent travel, history of clot, family history of bleeding disorder, OCP use, injury, calf pain, recent travel, or shortness of breath.  Over the past couple weeks, she is also been having epigastric discomfort, waterbrash, and burning after eating.  She was prescribed pantoprazole in the urgent care, however has not picked it up yet.  She has gained weight over the past several years.  Wt gain has gotten worse this year.  ROS:  Cardiac: No current chest pain Lungs: No SOB  Past Medical History:  Diagnosis Date  . No known health problems    Family History  Problem Relation Age of Onset  . Alcohol abuse Paternal Uncle   . Alcohol abuse Maternal Uncle   . Drug abuse Paternal Uncle   . Drug abuse Maternal Uncle   . Hypertension Father   . Diabetes Paternal Grandmother   . Diabetes Paternal Aunt    Nonsmoker  Allergies as of 02/23/2018   No Known Allergies     Medication List    as of 02/23/2018 10:00 AM   You have not been prescribed any medications.     BP 110/70 (BP Location: Left Arm, Patient Position: Sitting, Cuff Size: Normal)   Pulse 76   Temp 98.4 F (36.9 C) (Oral)   Ht 5\' 2"  (1.575 m)   Wt 143 lb 2 oz (64.9 kg)   SpO2 97%   BMI 26.18 kg/m  Gen: awake, alert, appears stated age HEENT: PERRLA, MMM Neck: No masses or asymmetry Heart: RRR, no bruits, no LE edema Lungs: CTAB, no accessory muscle use Abd: Soft, NT, ND, no masses or organomegaly MSK: chest pain is  reproducible to palptation Psych: Age appropriate judgment and insight, nml mood and affect  Gastroesophageal reflux disease, esophagitis presence not specified  Costochondritis  Orders as above.  We will continue pantoprazole for a total of 60 days total.  We will try to wean down after that if it is helpful.  Nonmedical options are also recommended such as dietary choices for her, wt loss and elevating hob. CP 2/2 #2, if cough improves, it will likely follow suit. No risk factors for clot.  F/u prn. The patient voiced understanding and agreement to the plan.  Erica Kaiser Erica VentressWendling, DO 02/23/18 10:00 AM

## 2018-02-23 NOTE — Patient Instructions (Addendum)
Send me a MyChart message in 3.5 weeks if pantoprazole (for reflux) is helping.  Your chest pain will likely get better with the resolution of your cough.  Let us know if you need anything.  The only lifestyle changes that have data behind them are weight loss for the overweight/obese and elevating the head of the bed. Finding out which foods/positions are triggers is important. 60 days of the pantoprazole will be the plan for now.    Costochondritis Costochondritis is swelling and irritation (inflammation) of the tissue (cartilage) that connects your ribs to your breastbone (sternum). This causes pain in the front of your chest. The pain usually starts gradually and involves more than one rib. What are the causes? The exact cause of this condition is not always known. It results from stress on the cartilage where your ribs attach to your sternum. The cause of this stress could be:  Chest injury (trauma).  Exercise or activity, such as lifting.  Severe coughing.  What increases the risk? You may be at higher risk for this condition if you:  Are female.  Are 1929?27 years old.  Recently started a new exercise or work activity.  Have low levels of vitamin D.  Have a condition that makes you cough frequently.  What are the signs or symptoms? The main symptom of this condition is chest pain. The pain:  Usually starts gradually and can be sharp or dull.  Gets worse with deep breathing, coughing, or exercise.  Gets better with rest.  May be worse when you press on the sternum-rib connection (tenderness).  How is this diagnosed? This condition is diagnosed based on your symptoms, medical history, and a physical exam. Your health care provider will check for tenderness when pressing on your sternum. This is the most important finding. You may also have tests to rule out other causes of chest pain. These may include:  A chest X-ray to check for lung problems.  An  electrocardiogram (ECG) to see if you have a heart problem that could be causing the pain.  An imaging scan to rule out a chest or rib fracture.  How is this treated? This condition usually goes away on its own over time. Your health care provider may prescribe an NSAID to reduce pain and inflammation. Your health care provider may also suggest that you:  Rest and avoid activities that make pain worse.  Apply heat or cold to the area to reduce pain and inflammation.  Do exercises to stretch your chest muscles.  If these treatments do not help, your health care provider may inject a numbing medicine at the sternum-rib connection to help relieve the pain. Follow these instructions at home:  Avoid activities that make pain worse. This includes any activities that use chest, abdominal, and side muscles.  If directed, put ice on the painful area: ? Put ice in a plastic bag. ? Place a towel between your skin and the bag. ? Leave the ice on for 20 minutes, 2-3 times a day.  If directed, apply heat to the affected area as often as told by your health care provider. Use the heat source that your health care provider recommends, such as a moist heat pack or a heating pad. ? Place a towel between your skin and the heat source. ? Leave the heat on for 20-30 minutes. ? Remove the heat if your skin turns bright red. This is especially important if you are unable to feel pain, heat, or cold.  You may have a greater risk of getting burned.  Take over-the-counter and prescription medicines only as told by your health care provider.  Return to your normal activities as told by your health care provider. Ask your health care provider what activities are safe for you.  Keep all follow-up visits as told by your health care provider. This is important. Contact a health care provider if:  You have chills or a fever.  Your pain does not go away or it gets worse.  You have a cough that does not go away  (is persistent). Get help right away if:  You have shortness of breath. This information is not intended to replace advice given to you by your health care provider. Make sure you discuss any questions you have with your health care provider. Document Released: 06/16/2005 Document Revised: 03/26/2016 Document Reviewed: 12/31/2015 Elsevier Interactive Patient Education  [employer].

## 2018-02-27 ENCOUNTER — Ambulatory Visit: Payer: BC Managed Care – PPO | Admitting: Family Medicine

## 2018-05-31 ENCOUNTER — Ambulatory Visit: Payer: BC Managed Care – PPO | Admitting: Medical

## 2018-05-31 ENCOUNTER — Encounter: Payer: Self-pay | Admitting: Medical

## 2018-05-31 VITALS — BP 114/67 | HR 66 | Temp 97.9°F | Resp 16 | Ht 62.0 in | Wt 141.8 lb

## 2018-05-31 DIAGNOSIS — T7840XA Allergy, unspecified, initial encounter: Secondary | ICD-10-CM | POA: Diagnosis not present

## 2018-05-31 DIAGNOSIS — L309 Dermatitis, unspecified: Secondary | ICD-10-CM | POA: Diagnosis not present

## 2018-05-31 MED ORDER — METHYLPREDNISOLONE ACETATE 40 MG/ML IJ SUSP
40.0000 mg | Freq: Once | INTRAMUSCULAR | Status: AC
  Administered 2018-05-31: 40 mg via INTRAMUSCULAR

## 2018-05-31 MED ORDER — HYDROXYZINE HCL 10 MG PO TABS: ORAL_TABLET | ORAL | 0 refills | Status: DC

## 2018-05-31 MED ORDER — NYSTATIN 100000 UNIT/GM EX CREA: 1.0000 "application " | TOPICAL_CREAM | Freq: Two times a day (BID) | CUTANEOUS | 0 refills | Status: DC

## 2018-05-31 MED ORDER — PREDNISONE 10 MG PO TABS: ORAL_TABLET | ORAL | 0 refills | Status: DC

## 2018-05-31 NOTE — Progress Notes (Signed)
Subjective:    Patient ID: Erica Kaiser, female    DOB: [birth year], 27 y.o.   MRN: [id number]  HPI  Pt in with rash on abdomen, upper chest and back.   Pt states over month she has mild upper chest itch for about a month. Then over past 2 weeks itching  of upper back and stomach.(small bumps on back and abdomen visible for past 3 days.  Pt states [facility] to [facility] and her work MD. [facility] gave Permethrin 2 days ago. She has not noticed impact yet. Her work MD thought maybe bug bites. She gave steroid cream. Rx [facility] [place].  Pt has recent rash that itches. Reports rash occurred after no known particular exposure. On review pt does not report any suspicious exposure to soaps, creams, detergents, make up, lotions, animal exposure exposure, plants or insect bites.  Pt rash is in the area stated above.  Pt reports no shortness of breath or wheezing.   No history of eczema.   LMP- now/presently.   Review of Systems  Constitutional: Negative for chills, fatigue and fever.  HENT: Negative for congestion, ear discharge, ear pain, facial swelling and hearing loss.   Respiratory: Negative for cough, chest tightness, shortness of breath and wheezing.   Cardiovascular: Negative for chest pain and palpitations.  Gastrointestinal: Negative for abdominal pain.  Skin: Positive for rash.  Neurological: Negative for dizziness and headaches.  Hematological: Negative for adenopathy. Does not bruise/bleed easily.  Psychiatric/Behavioral: Negative for behavioral problems, confusion and suicidal ideas. The patient is not hyperactive.    Past Medical History:  Diagnosis Date  . No known health problems      Social History   Socioeconomic History  . Marital status: Single    Spouse name: Not on file  . Number of children: Not on file  . Years of education: Not on file  . Highest education level: Not on file  Occupational History  . Occupation: Teacher, music  Social Needs  .  Financial resource strain: Not on file  . Food insecurity:    Worry: Not on file    Inability: Not on file  . Transportation needs:    Medical: Not on file    Non-medical: Not on file  Tobacco Use  . Smoking status: Never Smoker  . Smokeless tobacco: Never Used  Substance and Sexual Activity  . Alcohol use: Yes    Alcohol/week: 0.0 standard drinks    Comment: occ  . Drug use: No  . Sexual activity: Not on file  Lifestyle  . Physical activity:    Days per week: Not on file    Minutes per session: Not on file  . Stress: Not on file  Relationships  . Social connections:    Talks on phone: Not on file    Gets together: Not on file    Attends religious service: Not on file    Active member of club or organization: Not on file    Attends meetings of clubs or organizations: Not on file    Relationship status: Not on file  . Intimate partner violence:    Fear of current or ex partner: Not on file    Emotionally abused: Not on file    Physically abused: Not on file    Forced sexual activity: Not on file  Other Topics Concern  . Not on file  Social History Narrative   ** Merged History Encounter **  Exercises daily for 3-4 hours    Past Surgical History:  Procedure Laterality Date  . NO PAST SURGERIES      Family History  Problem Relation Age of Onset  . Alcohol abuse Paternal Uncle   . Alcohol abuse Maternal Uncle   . Drug abuse Paternal Uncle   . Drug abuse Maternal Uncle   . Hypertension Father   . Diabetes Paternal Grandmother   . Diabetes Paternal Aunt     No Known Allergies  Current Outpatient Medications on File Prior to Visit  Medication Sig Dispense Refill  . permethrin (ELIMITE) 5 % cream      No current facility-administered medications on file prior to visit.     BP 114/67   Pulse 66   Temp 97.9 F (36.6 C) (Oral)   Resp 16   Ht 5\' 2"  (1.575 m)   Wt 141 lb 12.8 oz (64.3 kg)   SpO2 100%   BMI 25.94 kg/m       Objective:   Physical  Exam  General Mental Status- Alert. General Appearance- Not in acute distress.   Skin Scattered slight papular rash upper chest and breast with some slight scarring. New and fewer numbered papules on abdomen and near bra line. Some on upper back as well.  Neck Carotid Arteries- Normal color. Moisture- Normal Moisture. No carotid bruits. No JVD.  Chest and Lung Exam Auscultation: Breath Sounds:-Normal.  Cardiovascular Auscultation:Rythm- Regular. Murmurs & Other Heart Sounds:Auscultation of the heart reveals- No Murmurs.  Abdomen Inspection:-Inspeection Normal. Palpation/Percussion:Note:No mass. Palpation and Percussion of the abdomen reveal- Non Tender, Non Distended + BS, no rebound or guarding.   Neurologic Cranial Nerve exam:- CN III-XII intact(No nystagmus), symmetric smile. Strength:- 5/5 equal and symmetric strength both upper and lower extremities.      Assessment & Plan:  The exact etiology of your  allergic reaction is  unknown . We gave you depo-medrol im injection 40 mg. I am also prescribing oral prednisone taper dose and hydroxyzine for itching. Your rash should gradually improve. If worsening or expanding  please notify us. If your rash reoccurs intermittently and no cause is identified then could consider allergist referral.  Keep well moisturized.  For slight under breast will make nystatin available.  Follow up in 7 days or as needed.  Esperanza Richters, PA-C

## 2018-05-31 NOTE — Patient Instructions (Signed)
The exact etiology of your  allergic reaction is  unknown . We gave you depo-medrol im injection 40 mg. I am also prescribing oral prednisone taper dose and hydroxyzine for itching. Your rash should gradually improve. If worsening or expanding  please notify us. If your rash reoccurs intermittently and no cause is identified then could consider allergist referral.  Keep well moisturized.  For slight under breast will make nystatin available.  Follow up in 7 days or as needed.

## 2018-10-31 ENCOUNTER — Ambulatory Visit: Payer: 59 | Admitting: Family Medicine

## 2018-10-31 ENCOUNTER — Encounter: Payer: Self-pay | Admitting: Family Medicine

## 2018-10-31 VITALS — BP 100/66 | HR 76 | Temp 98.4°F | Resp 16 | Ht 62.0 in | Wt 142.8 lb

## 2018-10-31 DIAGNOSIS — F4321 Adjustment disorder with depressed mood: Secondary | ICD-10-CM | POA: Diagnosis not present

## 2018-10-31 LAB — COMPREHENSIVE METABOLIC PANEL
ALT: 11 U/L (ref 0–35)
AST: 14 U/L (ref 0–37)
Albumin: 4.1 g/dL (ref 3.5–5.2)
Alkaline Phosphatase: 50 U/L (ref 39–117)
BUN: 11 mg/dL (ref 6–23)
CHLORIDE: 104 meq/L (ref 96–112)
CO2: 29 meq/L (ref 19–32)
Calcium: 9 mg/dL (ref 8.4–10.5)
Creatinine, Ser: 0.9 mg/dL (ref 0.40–1.20)
GFR: 91.48 mL/min (ref >60.00)
Glucose, Bld: 78 mg/dL (ref 70–99)
POTASSIUM: 4.4 meq/L (ref 3.5–5.1)
SODIUM: 139 meq/L (ref 135–145)
Total Bilirubin: 0.5 mg/dL (ref 0.2–1.2)
Total Protein: 6.7 g/dL (ref 6.0–8.3)

## 2018-10-31 LAB — CBC WITH DIFFERENTIAL/PLATELET
BASOS PCT: 1.2 % (ref 0.0–3.0)
Basophils Absolute: 0 10*3/uL (ref 0.0–0.1)
EOS PCT: 1.6 % (ref 0.0–5.0)
Eosinophils Absolute: 0.1 10*3/uL (ref 0.0–0.7)
HCT: 41.2 % (ref 36.0–46.0)
HEMOGLOBIN: 13.7 g/dL (ref 12.0–15.0)
LYMPHS ABS: 1.6 10*3/uL (ref 0.7–4.0)
Lymphocytes Relative: 50.3 % — ABNORMAL HIGH (ref 12.0–46.0)
MCHC: 33.2 g/dL (ref 30.0–36.0)
MCV: 84.4 fl (ref 78.0–100.0)
MONO ABS: 0.3 10*3/uL (ref 0.1–1.0)
Monocytes Relative: 9.7 % (ref 3.0–12.0)
NEUTROS ABS: 1.2 10*3/uL — AB (ref 1.4–7.7)
NEUTROS PCT: 37.2 % — AB (ref 43.0–77.0)
PLATELETS: 219 10*3/uL (ref 150.0–400.0)
RBC: 4.88 Mil/uL (ref 3.87–5.11)
RDW: 12.8 % (ref 11.5–15.5)
WBC: 3.3 10*3/uL — ABNORMAL LOW (ref 4.0–10.5)

## 2018-10-31 LAB — VITAMIN B12: VITAMIN B 12: 529 pg/mL (ref 211–911)

## 2018-10-31 LAB — TSH: TSH: 0.65 u[IU]/mL (ref 0.35–4.50)

## 2018-10-31 NOTE — Progress Notes (Signed)
Patient ID: Erica Kaiser, female    DOB: 1991-08-18  Age: 28 y.o. MRN: [id number]    Subjective:  Subjective  HPI Erica Kaiser presents for abd and breast pain but that has resolved.   She has been sad about her grand fathers death from pancreatic cancer.  He was in hospice at the end   Review of Systems  Constitutional: Negative for appetite change, diaphoresis, fatigue and unexpected weight change.  Eyes: Negative for pain, redness and visual disturbance.  Respiratory: Negative for cough, chest tightness, shortness of breath and wheezing.   Cardiovascular: Negative for chest pain, palpitations and leg swelling.  Endocrine: Negative for cold intolerance, heat intolerance, polydipsia, polyphagia and polyuria.  Genitourinary: Negative for difficulty urinating, dysuria and frequency.  Neurological: Negative for dizziness, light-headedness, numbness and headaches.  Psychiatric/Behavioral: Positive for dysphoric mood and sleep disturbance. Negative for behavioral problems, confusion, self-injury and suicidal ideas. The patient is not nervous/anxious.     History Past Medical History:  Diagnosis Date  . No known health problems     She has a past surgical history that includes No past surgeries.   Her family history includes Alcohol abuse in her maternal uncle and paternal uncle; Diabetes in her paternal aunt and paternal grandmother; Drug abuse in her maternal uncle and paternal uncle; Hypertension in her father; Pancreatic cancer (age of onset: 10) in her maternal grandfather.She reports that she has never smoked. She has never used smokeless tobacco. She reports current alcohol use. She reports that she does not use drugs.  No current outpatient medications on file prior to visit.   No current facility-administered medications on file prior to visit.      Objective:  Objective  Physical Exam Vitals signs and nursing note reviewed.  Constitutional:      Appearance: She is  well-developed.  HENT:     Head: Normocephalic and atraumatic.  Eyes:     Conjunctiva/sclera: Conjunctivae normal.  Neck:     Musculoskeletal: Normal range of motion and neck supple.     Thyroid: No thyromegaly.     Vascular: No carotid bruit or JVD.  Cardiovascular:     Rate and Rhythm: Normal rate and regular rhythm.     Heart sounds: Normal heart sounds. No murmur.  Pulmonary:     Effort: Pulmonary effort is normal. No respiratory distress.     Breath sounds: Normal breath sounds. No wheezing or rales.  Chest:     Chest wall: No tenderness.  Neurological:     Mental Status: She is alert and oriented to person, place, and time.  Psychiatric:        Mood and Affect: Mood is depressed. Mood is not anxious.        Behavior: Behavior normal.        Thought Content: Thought content normal.        Cognition and Memory: Cognition normal.        Judgment: Judgment normal.    BP 100/66 (BP Location: Right Arm, Cuff Size: Normal)   Pulse 76   Temp 98.4 F (36.9 C) (Oral)   Resp 16   Ht 5\' 2"  (1.575 m)   Wt 142 lb 12.8 oz (64.8 kg)   LMP 10/03/2018   SpO2 98%   BMI 26.12 kg/m  Wt Readings from Last 3 Encounters:  10/31/18 142 lb 12.8 oz (64.8 kg)  05/31/18 141 lb 12.8 oz (64.3 kg)  02/23/18 143 lb 2 oz (64.9 kg)     Lab Results  Component Value Date   WBC 4.8 11/23/2016   HGB 13.6 11/23/2016   HCT 41.0 11/23/2016   PLT 322.0 11/23/2016   GLUCOSE 74 11/23/2016   CHOL 133 01/05/2016   TRIG 65.0 01/05/2016   HDL 61.80 01/05/2016   LDLCALC 58 01/05/2016   ALT 15 11/23/2016   AST 19 11/23/2016   NA 137 11/23/2016   K 4.2 11/23/2016   CL 102 11/23/2016   CREATININE 0.90 11/23/2016   BUN 10 11/23/2016   CO2 29 11/23/2016   TSH 0.65 11/23/2016    No results found.   Assessment & Plan:  Plan  I have discontinued Jerrel Ivory Geers's permethrin, nystatin cream, predniSONE, and hydrOXYzine.  No orders of the defined types were placed in this encounter.   Problem  List Items Addressed This Visit    None    Visit Diagnoses    Grief reaction    -  Primary   Relevant Orders   TSH   Vitamin B12   CBC with Differential/Platelet   Comprehensive metabolic panel     pt given names and numbers of counselors to call she is also aware she can get free counseling through hospice  Follow-up: Return in about 3 months (around 01/29/2019), or if symptoms worsen or fail to improve, for annual exam, fasting.  Donato Schultz, DO

## 2018-10-31 NOTE — Patient Instructions (Signed)
Coping With Loss, Adult  People experience loss in many different ways throughout their lives. Events such as moving, changing jobs, and losing friends can create a sense of loss. The loss may be as serious as a major health change, divorce, death of a pet, or death of a loved one. All of these types of loss are likely to create a physical and emotional reaction known as grief. Grief is the result of a major change or an absence of something or someone that you count on. Grief is a normal reaction to loss.  How to recognize changes  A variety of factors can affect your grieving experience, including:  · The nature of your loss.  · Your relationship to what or whom you lost.  · Your understanding of grief and how to cope with it.  · Your support system.  The way that you deal with your grief will affect your ability to function as you normally do. When you are grieving, you may experience:  · Numbness, shock, sadness, anxiety, anger, denial, and guilt.  · Thoughts about death.  · Unexpected crying.  · A physical sensation of emptiness in your gut.  · Problems sleeping and eating.  · Fatigue.  · Loss of interest in normal activities.  · Dreaming about or imagining seeing the person who died.  · A need to remember what or whom you lost.  · Difficulty thinking about anything other than your loss for a period of time.  · Relief. If you have been expecting the loss for a while, you may feel a sense of relief when it happens.  Where to find support  To get support for coping with loss:  · Ask your health care provider for help and recommendations, such as grief counseling or therapy.  · Think about joining a support group for people who are coping with loss.  Follow these instructions at home:    · Be patient with yourself and others. Allow the grieving process to happen, and remember that grieving takes time.  ? It is likely that you may never feel completely done with some grief. You may find a way to move on while  still cherishing memories and feelings about your loss.  ? Accepting your loss is a process. It can take months or longer to adjust.  · Express your feelings in healthy ways, such as:  ? Talking with others about your loss. It may be helpful to find others who have had a similar loss, such as a support group.  ? Writing down your feelings in a journal.  ? Doing physical activities to release stress and emotional energy.  ? Doing creative activities like painting, sculpting, or playing or listening to music.  ? Practicing resilience. This is the ability to recover and adjust after facing challenges. Reading some resources that encourage resilience may help you to learn ways to practice those behaviors.  · Keep to your normal routine as much as possible. If you have trouble focusing or doing normal activities, it is acceptable to take some time away from your normal routine.  · Spend time with friends and loved ones.  · Eat a healthy diet, get plenty of sleep, and rest when you feel tired.  Where to find more information  You can find more information about coping with loss from:  · American Society of Clinical Oncology: www.cancer.net  · American Psychological Association: www.apa.org  Contact a health care provider if:  · Your grief   is extreme and keeps getting worse.  · You have ongoing grief that does not improve.  · Your body shows symptoms of grief, such as illness.  · You feel depressed, anxious, or lonely.  Get help right away if:  · You have thoughts about hurting yourself or others.  If you ever feel like you may hurt yourself or others, or have thoughts about taking your own life, get help right away. You can go to your nearest emergency department or call:  · Your local emergency services (911 in the [place]  · A suicide crisis helpline, such as the National Suicide Prevention Lifeline at 1-800-273-8255. This is open 24 hours a day.  Summary  · Grief is a normal part of experiencing a loss. It is the result  of a major change or an absence of something or someone that you count on.  · The depth of grief and the period of recovery depend on the type of loss as well as your ability to adjust to the change and process your feelings.  · Processing grief requires patience and a willingness to accept your feelings and talk about your loss with people who are supportive.  · It is important to find resources that work for you and to realize that we are all different when it comes to grief. There is not one single grieving process that works for everyone in the same way.  · Be aware that when grief becomes extreme, it can lead to more severe issues like isolation, depression, anxiety, or suicidal thoughts. Talk with your health care provider if you have any of these issues.  This information is not intended to replace advice given to you by your health care provider. Make sure you discuss any questions you have with your health care provider.  Document Released: 01/20/2017 Document Revised: 01/20/2017 Document Reviewed: 01/20/2017  Elsevier Interactive Patient Education © 2019 Elsevier Inc.

## 2019-01-22 ENCOUNTER — Encounter: Payer: Self-pay | Admitting: Family Medicine

## 2019-03-12 ENCOUNTER — Other Ambulatory Visit: Payer: Self-pay

## 2019-03-12 ENCOUNTER — Ambulatory Visit (INDEPENDENT_AMBULATORY_CARE_PROVIDER_SITE_OTHER): Payer: 59 | Admitting: Family Medicine

## 2019-03-12 ENCOUNTER — Encounter: Payer: Self-pay | Admitting: Family Medicine

## 2019-03-12 VITALS — Ht 62.0 in | Wt 142.0 lb

## 2019-03-12 DIAGNOSIS — K219 Gastro-esophageal reflux disease without esophagitis: Secondary | ICD-10-CM

## 2019-03-12 MED ORDER — OMEPRAZOLE 20 MG PO CPDR: 20.0000 mg | DELAYED_RELEASE_CAPSULE | Freq: Every day | ORAL | 3 refills | Status: AC

## 2019-03-12 NOTE — Progress Notes (Deleted)
Patient ID: Erica Kaiser, female    DOB: [birth year]  Age: 28 y.o. MRN: [id number]    Subjective:  Subjective  HPI Erica Kaiser presents for f/u [facility] for gerd.  She was given omeprazole and it helped but then had some bleeding and sharp pain    After eating pizza with peppers on it it aggrevated her stomach.  Review of Systems  History Past Medical History:  Diagnosis Date  . No known health problems     She has a past surgical history that includes No past surgeries.   Her family history includes Alcohol abuse in her maternal uncle and paternal uncle; Diabetes in her paternal aunt and paternal grandmother; Drug abuse in her maternal uncle and paternal uncle; Hypertension in her father; Pancreatic cancer (age of onset: 36) in her maternal grandfather.She reports that she has never smoked. She has never used smokeless tobacco. She reports current alcohol use. She reports that she does not use drugs.  Current Outpatient Medications on File Prior to Visit  Medication Sig Dispense Refill  . omeprazole (PRILOSEC) 20 MG capsule Take 20 mg by mouth daily.     No current facility-administered medications on file prior to visit.      Objective:  Objective  Physical Exam Ht 5\' 2"  (1.575 m)   Wt 142 lb (64.4 kg)   LMP 02/21/2019 (Exact Date)   BMI 25.97 kg/m  Wt Readings from Last 3 Encounters:  03/12/19 142 lb (64.4 kg)  10/31/18 142 lb 12.8 oz (64.8 kg)  05/31/18 141 lb 12.8 oz (64.3 kg)     Lab Results  Component Value Date   WBC 3.3 (L) 10/31/2018   HGB 13.7 10/31/2018   HCT 41.2 10/31/2018   PLT 219.0 10/31/2018   GLUCOSE 78 10/31/2018   CHOL 133 01/05/2016   TRIG 65.0 01/05/2016   HDL 61.80 01/05/2016   LDLCALC 58 01/05/2016   ALT 11 10/31/2018   AST 14 10/31/2018   NA 139 10/31/2018   K 4.4 10/31/2018   CL 104 10/31/2018   CREATININE 0.90 10/31/2018   BUN 11 10/31/2018   CO2 29 10/31/2018   TSH 0.65 10/31/2018    No results found.   Assessment & Plan:   Plan  I am having Erica Kaiser maintain her omeprazole.  No orders of the defined types were placed in this encounter.   Problem List Items Addressed This Visit    None      Follow-up: No follow-ups on file.  Ann Held, DO

## 2019-03-12 NOTE — Progress Notes (Signed)
Virtual Visit via Video Note  I connected with Erica Kaiser on 03/12/19 at  1:45 PM EDT by a video enabled telemedicine application and verified that I am speaking with the correct person using two identifiers.  Location: Patient: home Provider: office   I discussed the limitations of evaluation and management by telemedicine and the availability of in person appointments. The patient expressed understanding and agreed to proceed.  History of Present Illness: Pt is home c/o gerd symptoms -- [facility] and was given [facility]eprazole.  She took it for 2 weeks and stopped but then she had pizza  Past Medical History:  Diagnosis Date  . No known health problems    No current outpatient medications on file prior to visit.   No current facility-administered medications on file prior to visit.     Observations/Objective: No vitals obtained Pt is in NAD  Assessment and Plan: 1. Gastroesophageal reflux disease, esophagitis presence not specified Refill med Check labs  GI referral  - omeprazole (PRILOSEC) 20 MG capsule; Take 1 capsule (20 mg total) by mouth daily.  Dispense: 90 capsule; Refill: 3 - Ambulatory referral to Gastroenterology - H. pylori antibody, IgG; Future - CBC with Differential/Platelet; Future - Comprehensive metabolic panel; Future  Follow Up Instructions:    I discussed the assessment and treatment plan with the patient. The patient was provided an opportunity to ask questions and all were answered. The patient agreed with the plan and demonstrated an understanding of the instructions.   The patient was advised to call back or seek an in-person evaluation if the symptoms worsen or if the condition fails to improve as anticipated.  I provided 15 minutes of non-face-to-face time during this encounter.   Ann Held, DO

## 2019-03-16 ENCOUNTER — Encounter: Payer: Self-pay | Admitting: Gastroenterology

## 2019-03-16 ENCOUNTER — Other Ambulatory Visit: Payer: 59

## 2019-03-16 ENCOUNTER — Ambulatory Visit (INDEPENDENT_AMBULATORY_CARE_PROVIDER_SITE_OTHER): Payer: 59 | Admitting: Gastroenterology

## 2019-03-16 VITALS — Ht 62.0 in | Wt 142.0 lb

## 2019-03-16 DIAGNOSIS — R1013 Epigastric pain: Secondary | ICD-10-CM | POA: Diagnosis not present

## 2019-03-16 DIAGNOSIS — R1012 Left upper quadrant pain: Secondary | ICD-10-CM

## 2019-03-16 DIAGNOSIS — K219 Gastro-esophageal reflux disease without esophagitis: Secondary | ICD-10-CM

## 2019-03-16 NOTE — Patient Instructions (Addendum)
Your provider has requested that you go to the basement level for lab work before leaving today. Press "B" on the elevator. The lab is located at the first door on the left as you exit the elevator.   Hold on restarting omeprazole for 1 week until you have submitted stool sample.   You can my chart [place] your symptoms as well.   We will get you scheduled for a 4-6 week follow-up once our schedule is available.   Thank you for choosing me and [facility].  Dr. Rush Landmark

## 2019-03-16 NOTE — Progress Notes (Signed)
GASTROENTEROLOGY OUTPATIENT CLINIC VISIT   Primary Care Provider Ann Held, [address] [place] [address] [phone]  Referring Provider [address] [place] [address] [place],  [place] [address] [phone]  Patient Profile: Erica Kaiser is a 28 y.o. female without significant PMH.  The patient presents to the [facility] for an evaluation and management of problem(s) noted below:  Problem List 1. Gastroesophageal reflux disease, esophagitis presence not specified   2. Dyspepsia   3. LUQ pain     I connected with  Erica Kaiser on 03/16/19. I verified that I was speaking with the correct person using two identifiers. Due to the COVID-19 Pandemic, this service was provided via telemedicine using audio/visual media.  The patient was located at home. The provider was located in the office. The patient did consent to this visit and is aware of charges through their insurance as well as the limitations of evaluation and management by telemedicine. The patient was referred by primary care provider. Other persons participating in this telemedicine service were none. Time spent on visit was greater than 35 minutes in discussion and in coordination of care.   History of Present Illness This is the patient's first visit to the [facility] GI clinic.  Approximately 2-1/2 weeks ago she began to experience issues of "heartburn".  To her this was a burning sensation in the midepigastric region and left upper quadrant region and in the mid chest.  She has described a sensation of burping with acid taste in her mouth at times.  She is also waking up in the morning with water brash.  [facility] and was initiated on omeprazole this it helped with symptoms slightly.  She began to feel more significant left upper quadrant abdominal discomfort over the next few days.  Pain is present even when  she laid down.  If she eats anything that is too spicy or otitis she will have more significant abdominal discomfort.  Her omeprazole dosing was 20 mg daily.  However, now things are better.  She has not had any recurrent heartburn symptoms for approximately 6 days.  She has had no significant recurrence of the left upper quadrant discomfort either as long as she is monitoring her dietary intake.  Some mild nausea has persisted but no overt vomiting.  She denies any significant nonsteroidal use.  She has never had an upper or lower endoscopy.   GI Review of Systems Positive as above Negative for odynophagia, vomiting, hematemesis, coffee-ground emesis, early satiety, anorexia, change in bowel habits, melena, hematochezia  Review of Systems General: Denies fevers/chills/weight loss HEENT: Denies oral lesions Cardiovascular: Denies chest pain Pulmonary: Denies shortness of breath/nocturnal cough Gastroenterological: See HPI Genitourinary: Denies darkened urine Hematological: Denies easy bruising/bleeding Endocrine: Denies temperature intolerance Dermatological: Denies jaundice Psychological: Mood is stable   Medications Current Outpatient Medications  Medication Sig Dispense Refill  . omeprazole (PRILOSEC) 20 MG capsule Take 1 capsule (20 mg total) by mouth daily. 90 capsule 3   No current facility-administered medications for this visit.     Allergies No Known Allergies  Histories Past Medical History:  Diagnosis Date  . No known health problems    Past Surgical History:  Procedure Laterality Date  . NO PAST SURGERIES     Social History   Socioeconomic History  . Marital status: Single    Spouse name: Not on file  .  Number of children: Not on file  . Years of education: Not on file  . Highest education level: Not on file  Occupational History  . Occupation: Psychologist, sport and exercise  Social Needs  . Financial resource strain: Not on file  . Food insecurity    Worry: Not on file     Inability: Not on file  . Transportation needs    Medical: Not on file    Non-medical: Not on file  Tobacco Use  . Smoking status: Never Smoker  . Smokeless tobacco: Never Used  Substance and Sexual Activity  . Alcohol use: Yes    Alcohol/week: 0.0 standard drinks    Comment: occ  . Drug use: No  . Sexual activity: Not on file  Lifestyle  . Physical activity    Days per week: Not on file    Minutes per session: Not on file  . Stress: Not on file  Relationships  . Social Herbalist on phone: Not on file    Gets together: Not on file    Attends religious service: Not on file    Active member of club or organization: Not on file    Attends meetings of clubs or organizations: Not on file    Relationship status: Not on file  . Intimate partner violence    Fear of current or ex partner: Not on file    Emotionally abused: Not on file    Physically abused: Not on file    Forced sexual activity: Not on file  Other Topics Concern  . Not on file  Social History Narrative   ** Merged History Encounter **       Exercises daily for 3-4 hours   Family History  Problem Relation Age of Onset  . Alcohol abuse Paternal Uncle   . Alcohol abuse Maternal Uncle   . Drug abuse Paternal Uncle   . Drug abuse Maternal Uncle   . Hypertension Father   . Diabetes Paternal Grandmother   . Diabetes Paternal Aunt   . Pancreatic cancer Maternal Grandfather 7  . Colon cancer Neg Hx   . Esophageal cancer Neg Hx   . Inflammatory bowel disease Neg Hx   . Liver disease Neg Hx   . Rectal cancer Neg Hx   . Stomach cancer Neg Hx    I have reviewed her medical, social, and family history in detail and updated the electronic medical record as necessary.    PHYSICAL EXAMINATION  Telehealth visit   REVIEW OF DATA  I reviewed the following data at the time of this encounter:  GI Procedures and Studies  No relevant studies to review  Laboratory Studies  Reviewed in epic  Imaging  Studies  No relevant studies to review   ASSESSMENT  [name] is a 28 y.o. female without significant PMH.  The patient is seen today for evaluation and management of:  1. Gastroesophageal reflux disease, esophagitis presence not specified   2. Dyspepsia   3. LUQ pain    The patient is hemodynamically and clinically stable.  It seems that the patient did have a short period of time of GERD.  Some mild dyspepsia and left upper quadrant abdominal discomfort.  This could be consistent with underlying dyspepsia or gastritis and/or GERD.  Thankfully she is doing better.  I think it is reasonable as she has been off PPI for approximately a week at this point for her to stay off of it for another week to week  and a half.  We should obtain a stool antigen for H. pylori.  We will obtain some baseline laboratories as well to further evaluate the potential source of her abdominal discomfort.  If work-up is unremarkable and patient does not have recurrence of abdominal pain she will not require diagnostic endoscopy otherwise we will have to consider that.  We will consider abdominal ultrasound imaging if the discomfort recurs and if endoscopy is unremarkable.  All patient questions were answered, to the best of my ability, and the patient agrees to the aforementioned plan of action with follow-up as indicated.   PLAN  Laboratories as outlined below H. pylori stool antigen to be obtained (off PPI for at least 2 weeks) If patient has recurrence of symptoms she may restart a PPI at 40 mg daily We will consider abdominal ultrasound imaging as well as diagnostic endoscopy based on recurrence of abdominal pain as well as findings on her laboratories   Orders Placed This Encounter  Procedures  . Helicobacter pylori special antigen  . Comp Met (CMET)  . Amylase  . Lipase  . IgA  . Tissue transglutaminase, IgA    New Prescriptions   No medications on file   Modified Medications   No medications on file     Planned Follow Up No follow-ups on file.   Justice Britain, MD [facility] Advanced [facility]oscopy Office # [phone]

## 2019-03-17 DIAGNOSIS — R1013 Epigastric pain: Secondary | ICD-10-CM | POA: Insufficient documentation

## 2019-03-17 DIAGNOSIS — R1012 Left upper quadrant pain: Secondary | ICD-10-CM | POA: Insufficient documentation

## 2019-04-10 ENCOUNTER — Encounter: Payer: 59 | Admitting: Family Medicine

## 2019-04-10 DIAGNOSIS — Z0289 Encounter for other administrative examinations: Secondary | ICD-10-CM

## 2019-04-19 ENCOUNTER — Telehealth: Payer: Self-pay | Admitting: Gastroenterology

## 2019-04-19 NOTE — Telephone Encounter (Signed)
Returned pt call. Pt instructed that she will need to come to the lab located in the basement to pick-up stool study kit. Pt voiced understanding.

## 2019-04-20 ENCOUNTER — Other Ambulatory Visit: Payer: 59

## 2019-04-20 DIAGNOSIS — R1012 Left upper quadrant pain: Secondary | ICD-10-CM

## 2019-04-20 DIAGNOSIS — R1013 Epigastric pain: Secondary | ICD-10-CM

## 2019-04-20 DIAGNOSIS — K219 Gastro-esophageal reflux disease without esophagitis: Secondary | ICD-10-CM

## 2019-04-24 LAB — HELICOBACTER PYLORI  SPECIAL ANTIGEN
MICRO NUMBER:: 725132
SPECIMEN QUALITY: ADEQUATE

## 2019-06-19 ENCOUNTER — Encounter: Payer: Self-pay | Admitting: Family Medicine

## 2019-06-19 ENCOUNTER — Ambulatory Visit (INDEPENDENT_AMBULATORY_CARE_PROVIDER_SITE_OTHER): Payer: 59 | Admitting: Family Medicine

## 2019-06-19 ENCOUNTER — Other Ambulatory Visit: Payer: Self-pay

## 2019-06-19 VITALS — BP 100/68 | HR 73 | Temp 97.9°F | Resp 18 | Ht 62.0 in | Wt 145.8 lb

## 2019-06-19 DIAGNOSIS — N926 Irregular menstruation, unspecified: Secondary | ICD-10-CM | POA: Diagnosis not present

## 2019-06-19 DIAGNOSIS — Z0000 Encounter for general adult medical examination without abnormal findings: Secondary | ICD-10-CM

## 2019-06-19 LAB — CBC WITH DIFFERENTIAL/PLATELET
Basophils Absolute: 0 10*3/uL (ref 0.0–0.1)
Basophils Relative: 0.9 % (ref 0.0–3.0)
Eosinophils Absolute: 0.1 10*3/uL (ref 0.0–0.7)
Eosinophils Relative: 2 % (ref 0.0–5.0)
HCT: 41.4 % (ref 36.0–46.0)
Hemoglobin: 13.5 g/dL (ref 12.0–15.0)
Lymphocytes Relative: 56.9 % — ABNORMAL HIGH (ref 12.0–46.0)
Lymphs Abs: 2.1 10*3/uL (ref 0.7–4.0)
MCHC: 32.5 g/dL (ref 30.0–36.0)
MCV: 84.4 fl (ref 78.0–100.0)
Monocytes Absolute: 0.3 10*3/uL (ref 0.1–1.0)
Monocytes Relative: 9.2 % (ref 3.0–12.0)
Neutro Abs: 1.2 10*3/uL — ABNORMAL LOW (ref 1.4–7.7)
Neutrophils Relative %: 31 % — ABNORMAL LOW (ref 43.0–77.0)
Platelets: 244 10*3/uL (ref 150.0–400.0)
RBC: 4.91 Mil/uL (ref 3.87–5.11)
RDW: 12.7 % (ref 11.5–15.5)
WBC: 3.7 10*3/uL — ABNORMAL LOW (ref 4.0–10.5)

## 2019-06-19 LAB — COMPREHENSIVE METABOLIC PANEL
ALT: 15 U/L (ref 0–35)
AST: 17 U/L (ref 0–37)
Albumin: 4.3 g/dL (ref 3.5–5.2)
Alkaline Phosphatase: 52 U/L (ref 39–117)
BUN: 11 mg/dL (ref 6–23)
CO2: 27 mEq/L (ref 19–32)
Calcium: 9.2 mg/dL (ref 8.4–10.5)
Chloride: 104 mEq/L (ref 96–112)
Creatinine, Ser: 0.91 mg/dL (ref 0.40–1.20)
GFR: 89.88 mL/min (ref >60.00)
Glucose, Bld: 83 mg/dL (ref 70–99)
Potassium: 4.1 mEq/L (ref 3.5–5.1)
Sodium: 137 mEq/L (ref 135–145)
Total Bilirubin: 0.5 mg/dL (ref 0.2–1.2)
Total Protein: 7.2 g/dL (ref 6.0–8.3)

## 2019-06-19 LAB — LIPID PANEL
Cholesterol: 133 mg/dL (ref 0–200)
HDL: 57.2 mg/dL (ref >39.00)
LDL Cholesterol: 66 mg/dL (ref 0–99)
NonHDL: 75.86
Total CHOL/HDL Ratio: 2
Triglycerides: 51 mg/dL (ref 0.0–149.0)
VLDL: 10.2 mg/dL (ref 0.0–40.0)

## 2019-06-19 LAB — POCT URINE PREGNANCY: Preg Test, Ur: NEGATIVE

## 2019-06-19 LAB — TSH: TSH: 0.56 u[IU]/mL (ref 0.35–4.50)

## 2019-06-19 MED ORDER — NORETHIN ACE-ETH ESTRAD-FE 1-20 MG-MCG PO TABS: 1.0000 | ORAL_TABLET | Freq: Every day | ORAL | 11 refills | Status: AC

## 2019-06-19 NOTE — Progress Notes (Signed)
Subjective:     Erica Kaiser is a 28 y.o. female and is here for a comprehensive physical exam. The patient reports problems - irregular periods all her life.  she has had ec bx in the past at 16 and was on bcp in past .  Social History   Socioeconomic History  . Marital status: Single    Spouse name: Not on file  . Number of children: Not on file  . Years of education: Not on file  . Highest education level: Not on file  Occupational History  . Occupation: Psychologist, sport and exercise  Social Needs  . Financial resource strain: Not on file  . Food insecurity    Worry: Not on file    Inability: Not on file  . Transportation needs    Medical: Not on file    Non-medical: Not on file  Tobacco Use  . Smoking status: Never Smoker  . Smokeless tobacco: Never Used  Substance and Sexual Activity  . Alcohol use: Yes    Alcohol/week: 0.0 standard drinks    Comment: occ  . Drug use: No  . Sexual activity: Not on file  Lifestyle  . Physical activity    Days per week: Not on file    Minutes per session: Not on file  . Stress: Not on file  Relationships  . Social Herbalist on phone: Not on file    Gets together: Not on file    Attends religious service: Not on file    Active member of club or organization: Not on file    Attends meetings of clubs or organizations: Not on file    Relationship status: Not on file  . Intimate partner violence    Fear of current or ex partner: Not on file    Emotionally abused: Not on file    Physically abused: Not on file    Forced sexual activity: Not on file  Other Topics Concern  . Not on file  Social History Narrative   ** Merged History Encounter **       Exercises daily for 3-4 hours   Health Maintenance  Topic Date Due  . PAP-Cervical Cytology Screening  05/05/2019  . PAP SMEAR-Modifier  05/05/2019  . INFLUENZA VACCINE  12/19/2019 (Originally 04/21/2019)  . TETANUS/TDAP  09/20/2020  . HIV Screening  Completed    The following  portions of the patient's history were reviewed and updated as appropriate:  She  has a past medical history of No known health problems. She does not have any pertinent problems on file. She  has a past surgical history that includes No past surgeries. Her family history includes Alcohol abuse in her maternal uncle and paternal uncle; Diabetes in her paternal aunt and paternal grandmother; Drug abuse in her maternal uncle and paternal uncle; Hypertension in her father; Pancreatic cancer (age of onset: 44) in her maternal grandfather. She  reports that she has never smoked. She has never used smokeless tobacco. She reports current alcohol use. She reports that she does not use drugs. She has a current medication list which includes the following prescription(s): norethindrone-ethinyl estradiol and omeprazole. Current Outpatient Medications on File Prior to Visit  Medication Sig Dispense Refill  . omeprazole (PRILOSEC) 20 MG capsule Take 1 capsule (20 mg total) by mouth daily. (Patient not taking: Reported on 06/19/2019) 90 capsule 3   No current facility-administered medications on file prior to visit.    She has No Known Allergies..  Review of  Systems Review of Systems  Constitutional: Negative for activity change, appetite change and fatigue.  HENT: Negative for hearing loss, congestion, tinnitus and ear discharge.  dentist q48m Eyes: Negative for visual disturbance (see optho q1y -- vision corrected to 20/20 with glasses).  Respiratory: Negative for cough, chest tightness and shortness of breath.   Cardiovascular: Negative for chest pain, palpitations and leg swelling.  Gastrointestinal: Negative for abdominal pain, diarrhea, constipation and abdominal distention.  Genitourinary: Negative for urgency, frequency, decreased urine volume and difficulty urinating.  Musculoskeletal: Negative for back pain, arthralgias and gait problem.  Skin: Negative for color change, pallor and rash.   Neurological: Negative for dizziness, light-headedness, numbness and headaches.  Hematological: Negative for adenopathy. Does not bruise/bleed easily.  Psychiatric/Behavioral: Negative for suicidal ideas, confusion, sleep disturbance, self-injury, dysphoric mood, decreased concentration and agitation.       Objective:    BP 100/68 (BP Location: Right Arm, Patient Position: Sitting, Cuff Size: Normal)   Pulse 73   Temp 97.9 F (36.6 C) (Temporal)   Resp 18   Ht 5\' 2"  (1.575 m)   Wt 145 lb 12.8 oz (66.1 kg)   LMP 04/03/2019   SpO2 98%   BMI 26.67 kg/m  General appearance: alert, cooperative, appears stated age and no distress Head: Normocephalic, without obvious abnormality, atraumatic Eyes: conjunctivae/corneas clear. PERRL, EOM's intact. Fundi benign. Ears: normal TM's and external ear canals both ears Nose: Nares normal. Septum midline. Mucosa normal. No drainage or sinus tenderness. Throat: lips, mucosa, and tongue normal; teeth and gums normal Neck: no adenopathy, no carotid bruit, no JVD, supple, symmetrical, trachea midline and thyroid not enlarged, symmetric, no tenderness/mass/nodules Back: symmetric, no curvature. ROM normal. No CVA tenderness. Lungs: clear to auscultation bilaterally Breasts: gyn Heart: regular rate and rhythm, S1, S2 normal, no murmur, click, rub or gallop Abdomen: soft, non-tender; bowel sounds normal; no masses,  no organomegaly Pelvic: deferred--gyn Extremities: extremities normal, atraumatic, no cyanosis or edema Pulses: 2+ and symmetric Skin: Skin color, texture, turgor normal. No rashes or lesions Lymph nodes: Cervical, supraclavicular, and axillary nodes normal. Neurologic: Alert and oriented X 3, normal strength and tone. Normal symmetric reflexes. Normal coordination and gait    Assessment:    Healthy female exam.      Plan:    ghm utd-- pt refused flu shot See After Visit Summary for Counseling Recommendations    1. Irregular  periods Will start bcp if preg test neg  Refer to ob/ gyn for work up  - Ambulatory referral to Obstetrics / Gynecology - POCT urine pregnancy - norethindrone-ethinyl estradiol (LOESTRIN FE) 1-20 MG-MCG tablet; Take 1 tablet by mouth daily.  Dispense: 1 Package; Refill: 11  2. Preventative health care See above - Lipid panel - CBC with Differential/Platelet - TSH - Comprehensive metabolic panel

## 2019-06-19 NOTE — Patient Instructions (Signed)
Preventive Care 21-27 Years Old, Female Preventive care refers to visits with your health care provider and lifestyle choices that can promote health and wellness. This includes:  A yearly physical exam. This may also be called an annual well check.  Regular dental visits and eye exams.  Immunizations.  Screening for certain conditions.  Healthy lifestyle choices, such as eating a healthy diet, getting regular exercise, not using drugs or products that contain nicotine and tobacco, and limiting alcohol use. What can I expect for my preventive care visit? Physical exam Your health care provider will check your:  Height and weight. This may be used to calculate body mass index (BMI), which tells if you are at a healthy weight.  Heart rate and blood pressure.  Skin for abnormal spots. Counseling Your health care provider may ask you questions about your:  Alcohol, tobacco, and drug use.  Emotional well-being.  Home and relationship well-being.  Sexual activity.  Eating habits.  Work and work environment.  Method of birth control.  Menstrual cycle.  Pregnancy history. What immunizations do I need?  Influenza (flu) vaccine  This is recommended every year. Tetanus, diphtheria, and pertussis (Tdap) vaccine  You may need a Td booster every 10 years. Varicella (chickenpox) vaccine  You may need this if you have not been vaccinated. Human papillomavirus (HPV) vaccine  If recommended by your health care provider, you may need three doses over 6 months. Measles, mumps, and rubella (MMR) vaccine  You may need at least one dose of MMR. You may also need a second dose. Meningococcal conjugate (MenACWY) vaccine  One dose is recommended if you are age 19-21 years and a first-year college student living in a residence hall, or if you have one of several medical conditions. You may also need additional booster doses. Pneumococcal conjugate (PCV13) vaccine  You may need  this if you have certain conditions and were not previously vaccinated. Pneumococcal polysaccharide (PPSV23) vaccine  You may need one or two doses if you smoke cigarettes or if you have certain conditions. Hepatitis A vaccine  You may need this if you have certain conditions or if you travel or work in places where you may be exposed to hepatitis A. Hepatitis B vaccine  You may need this if you have certain conditions or if you travel or work in places where you may be exposed to hepatitis B. Haemophilus influenzae type b (Hib) vaccine  You may need this if you have certain conditions. You may receive vaccines as individual doses or as more than one vaccine together in one shot (combination vaccines). Talk with your health care provider about the risks and benefits of combination vaccines. What tests do I need?  Blood tests  Lipid and cholesterol levels. These may be checked every 5 years starting at age 20.  Hepatitis C test.  Hepatitis B test. Screening  Diabetes screening. This is done by checking your blood sugar (glucose) after you have not eaten for a while (fasting).  Sexually transmitted disease (STD) testing.  BRCA-related cancer screening. This may be done if you have a family history of breast, ovarian, tubal, or peritoneal cancers.  Pelvic exam and Pap test. This may be done every 3 years starting at age 21. Starting at age 30, this may be done every 5 years if you have a Pap test in combination with an HPV test. Talk with your health care provider about your test results, treatment options, and if necessary, the need for more tests.   Follow these instructions at home: Eating and drinking   Eat a diet that includes fresh fruits and vegetables, whole grains, lean protein, and low-fat dairy.  Take vitamin and mineral supplements as recommended by your health care provider.  Do not drink alcohol if: ? Your health care provider tells you not to drink. ? You are  pregnant, may be pregnant, or are planning to become pregnant.  If you drink alcohol: ? Limit how much you have to 0-1 drink a day. ? Be aware of how much alcohol is in your drink. In the [place] one drink equals one 12 oz bottle of beer (355 mL), one 5 oz glass of wine (148 mL), or one 1 oz glass of hard liquor (44 mL). Lifestyle  Take daily care of your teeth and gums.  Stay active. Exercise for at least 30 minutes on 5 or more days each week.  Do not use any products that contain nicotine or tobacco, such as cigarettes, e-cigarettes, and chewing tobacco. If you need help quitting, ask your health care provider.  If you are sexually active, practice safe sex. Use a condom or other form of birth control (contraception) in order to prevent pregnancy and STIs (sexually transmitted infections). If you plan to become pregnant, see your health care provider for a preconception visit. What's next?  Visit your health care provider once a year for a well check visit.  Ask your health care provider how often you should have your eyes and teeth checked.  Stay up to date on all vaccines. This information is not intended to replace advice given to you by your health care provider. Make sure you discuss any questions you have with your health care provider. Document Released: 11/02/2001 Document Revised: 05/18/2018 Document Reviewed: 05/18/2018 Elsevier Patient Education  2020 Elsevier Inc.  

## 2019-07-02 ENCOUNTER — Encounter: Payer: Self-pay | Admitting: Family Medicine

## 2019-12-19 ENCOUNTER — Other Ambulatory Visit: Payer: Self-pay

## 2019-12-20 ENCOUNTER — Encounter: Payer: Self-pay | Admitting: Family Medicine

## 2019-12-20 ENCOUNTER — Ambulatory Visit (INDEPENDENT_AMBULATORY_CARE_PROVIDER_SITE_OTHER): Payer: 59 | Admitting: Family Medicine

## 2019-12-20 ENCOUNTER — Other Ambulatory Visit: Payer: Self-pay

## 2019-12-20 VITALS — BP 110/70 | HR 80 | Temp 97.0°F | Resp 18 | Ht 62.0 in | Wt 149.8 lb

## 2019-12-20 DIAGNOSIS — N644 Mastodynia: Secondary | ICD-10-CM

## 2019-12-20 DIAGNOSIS — N912 Amenorrhea, unspecified: Secondary | ICD-10-CM | POA: Diagnosis not present

## 2019-12-20 LAB — POCT URINE PREGNANCY: Preg Test, Ur: NEGATIVE

## 2019-12-20 NOTE — Progress Notes (Signed)
Patient ID: Erica Kaiser, female    DOB: December 31, 1990  Age: 29 y.o. MRN: [id number]    Subjective:  Subjective  HPI My Erica Kaiser presents for L breast pain x2 weeks.   Pt has not had a period in years.   She denies sexual activity.   Pt states she can not feel anything in the breast it is just painful.    No other complaints  Review of Systems  Constitutional: Negative for appetite change, diaphoresis, fatigue and unexpected weight change.  Eyes: Negative for pain, redness and visual disturbance.  Respiratory: Negative for cough, chest tightness, shortness of breath and wheezing.   Cardiovascular: Negative for chest pain, palpitations and leg swelling.  Endocrine: Negative for cold intolerance, heat intolerance, polydipsia, polyphagia and polyuria.  Genitourinary: Negative for difficulty urinating, dysuria and frequency.  Neurological: Negative for dizziness, light-headedness, numbness and headaches.    History Past Medical History:  Diagnosis Date  . No known health problems     She has a past surgical history that includes No past surgeries.   Her family history includes Alcohol abuse in her maternal uncle and paternal uncle; Diabetes in her paternal aunt and paternal grandmother; Drug abuse in her maternal uncle and paternal uncle; Hypertension in her father; Pancreatic cancer (age of onset: 48) in her maternal grandfather.She reports that she has never smoked. She has never used smokeless tobacco. She reports current alcohol use. She reports that she does not use drugs.  Current Outpatient Medications on File Prior to Visit  Medication Sig Dispense Refill  . amoxicillin (AMOXIL) 500 MG tablet     . ibuprofen (ADVIL) 800 MG tablet Take 800 mg by mouth every 6 (six) hours as needed.    . norethindrone-ethinyl estradiol (LOESTRIN FE) 1-20 MG-MCG tablet Take 1 tablet by mouth daily. 1 Package 11  . traMADol (ULTRAM) 50 MG tablet Take 50 mg by mouth 5 (five) times daily as needed.     Marland Kitchen omeprazole (PRILOSEC) 20 MG capsule Take 1 capsule (20 mg total) by mouth daily. (Patient not taking: Reported on 06/19/2019) 90 capsule 3   No current facility-administered medications on file prior to visit.     Objective:  Objective  Physical Exam Vitals and nursing note reviewed.  Constitutional:      Appearance: She is well-developed.  HENT:     Head: Normocephalic and atraumatic.  Eyes:     Conjunctiva/sclera: Conjunctivae normal.  Neck:     Thyroid: No thyromegaly.     Vascular: No carotid bruit or JVD.  Cardiovascular:     Rate and Rhythm: Normal rate and regular rhythm.     Heart sounds: Normal heart sounds. No murmur.  Pulmonary:     Effort: Pulmonary effort is normal. No respiratory distress.     Breath sounds: Normal breath sounds. No wheezing or rales.  Chest:     Chest wall: No tenderness.    Musculoskeletal:     Cervical back: Normal range of motion and neck supple.  Neurological:     Mental Status: She is alert and oriented to person, place, and time.    BP 110/70 (BP Location: Left Arm, Patient Position: Sitting, Cuff Size: Normal)   Pulse 80   Temp (!) 97 F (36.1 C) (Temporal)   Resp 18   Ht 5\' 2"  (1.575 m)   Wt 149 lb 12.8 oz (67.9 kg)   SpO2 97%   BMI 27.40 kg/m  Wt Readings from Last 3 Encounters:  12/20/19 149 lb 12.8  oz (67.9 kg)  06/19/19 145 lb 12.8 oz (66.1 kg)  03/16/19 142 lb (64.4 kg)     Lab Results  Component Value Date   WBC 3.7 (L) 06/19/2019   HGB 13.5 06/19/2019   HCT 41.4 06/19/2019   PLT 244.0 06/19/2019   GLUCOSE 83 06/19/2019   CHOL 133 06/19/2019   TRIG 51.0 06/19/2019   HDL 57.20 06/19/2019   LDLCALC 66 06/19/2019   ALT 15 06/19/2019   AST 17 06/19/2019   NA 137 06/19/2019   K 4.1 06/19/2019   CL 104 06/19/2019   CREATININE 0.91 06/19/2019   BUN 11 06/19/2019   CO2 27 06/19/2019   TSH 0.56 06/19/2019    No results found.   Assessment & Plan:  Plan  I am having Tyria Springer maintain her  omeprazole, norethindrone-ethinyl estradiol, amoxicillin, traMADol, and ibuprofen.  No orders of the defined types were placed in this encounter.   Problem List Items Addressed This Visit      Unprioritized   Amenorrhea - Primary    Pt does not want bcp Will refer to gyn  preg test neg      Relevant Orders   POCT urine pregnancy (Completed)   Ambulatory referral to Obstetrics / Gynecology    Other Visit Diagnoses    Breast pain       Relevant Orders   MM Digital Diagnostic Bilat   US BREAST COMPLETE UNI LEFT INC AXILLA   Ambulatory referral to Obstetrics / Gynecology      Follow-up: Return if symptoms worsen or fail to improve.  Erica Schultz, DO

## 2019-12-20 NOTE — Patient Instructions (Signed)

## 2019-12-20 NOTE — Assessment & Plan Note (Addendum)
Pt does not want bcp Will refer to gyn  preg test neg

## 2019-12-25 ENCOUNTER — Other Ambulatory Visit: Payer: 59

## 2020-01-01 ENCOUNTER — Other Ambulatory Visit: Payer: Self-pay

## 2020-01-01 ENCOUNTER — Ambulatory Visit
Admission: RE | Admit: 2020-01-01 | Discharge: 2020-01-01 | Disposition: A | Discharge status: Home / Self Care | Payer: 59 | Source: Ambulatory Visit | Attending: Family Medicine | Admitting: Family Medicine

## 2020-01-01 DIAGNOSIS — N644 Mastodynia: Secondary | ICD-10-CM

## 2020-01-16 ENCOUNTER — Telehealth (INDEPENDENT_AMBULATORY_CARE_PROVIDER_SITE_OTHER): Payer: 59 | Admitting: Family Medicine

## 2020-01-16 ENCOUNTER — Other Ambulatory Visit: Payer: Self-pay

## 2020-01-16 ENCOUNTER — Encounter: Payer: Self-pay | Admitting: Family Medicine

## 2020-01-16 VITALS — Ht 62.0 in

## 2020-01-16 DIAGNOSIS — J0140 Acute pansinusitis, unspecified: Secondary | ICD-10-CM

## 2020-01-16 MED ORDER — AMOXICILLIN-POT CLAVULANATE 875-125 MG PO TABS: 1.0000 | ORAL_TABLET | Freq: Two times a day (BID) | ORAL | 0 refills | Status: AC

## 2020-01-16 MED ORDER — FLUTICASONE PROPIONATE 50 MCG/ACT NA SUSP: 2.0000 | Freq: Every day | NASAL | 6 refills | Status: AC

## 2020-01-16 MED ORDER — PROMETHAZINE-DM 6.25-15 MG/5ML PO SYRP: 5.0000 mL | ORAL_SOLUTION | Freq: Four times a day (QID) | ORAL | 0 refills | Status: AC | PRN

## 2020-01-16 NOTE — Progress Notes (Signed)
Virtual Visit via Video Note  I connected with Erica Kaiser on 01/16/20 at  9:20 AM EDT by a video enabled telemedicine application and verified that I am speaking with the correct person using two identifiers.  Location: Patient: home  Provider: home    I discussed the limitations of evaluation and management by telemedicine and the availability of in person appointments. The patient expressed understanding and agreed to proceed.  History of Present Illness: Pt woke up this am with a fever-- she did not take her temp -- she does not have a thermometer  + headache + congestion and frontal sinus pressure  She has taken allegra and tylenol with little relief  + prod cough  Observations/Objective: There were no vitals filed for this visit.  Pt is in nad No sob  Assessment and Plan: 1. Acute non-recurrent pansinusitis abx , flonase and cough med sent to pharmacy  Pt will also get a covid test and quarantine at home until results come in   amoxicillin-clavulanate (AUGMENTIN) 875-125 MG tablet; Take 1 tablet by mouth 2 (two) times daily.  Dispense: 20 tablet; Refill: 0 - fluticasone (FLONASE) 50 MCG/ACT nasal spray; Place 2 sprays into both nostrils daily.  Dispense: 16 g; Refill: 6 - promethazine-dextromethorphan (PROMETHAZINE-DM) 6.25-15 MG/5ML syrup; Take 5 mLs by mouth 4 (four) times daily as needed.  Dispense: 118 mL; Refill: 0   Follow Up Instructions:    I discussed the assessment and treatment plan with the patient. The patient was provided an opportunity to ask questions and all were answered. The patient agreed with the plan and demonstrated an understanding of the instructions.   The patient was advised to call back or seek an in-person evaluation if the symptoms worsen or if the condition fails to improve as anticipated.  I provided 25 minutes of non-face-to-face time during this encounter.   Donato Schultz, DO

## 2020-01-23 ENCOUNTER — Encounter: Payer: 59 | Admitting: Obstetrics & Gynecology

## 2020-02-14 ENCOUNTER — Other Ambulatory Visit (HOSPITAL_COMMUNITY)
Admission: RE | Admit: 2020-02-14 | Discharge: 2020-02-14 | Disposition: A | Discharge status: Home / Self Care | Payer: 59 | Source: Ambulatory Visit | Attending: Obstetrics & Gynecology | Admitting: Obstetrics & Gynecology

## 2020-02-14 ENCOUNTER — Encounter: Payer: Self-pay | Admitting: Obstetrics & Gynecology

## 2020-02-14 ENCOUNTER — Ambulatory Visit (INDEPENDENT_AMBULATORY_CARE_PROVIDER_SITE_OTHER): Payer: 59 | Admitting: Obstetrics & Gynecology

## 2020-02-14 ENCOUNTER — Encounter: Payer: 59 | Admitting: Obstetrics & Gynecology

## 2020-02-14 ENCOUNTER — Other Ambulatory Visit: Payer: Self-pay

## 2020-02-14 ENCOUNTER — Ambulatory Visit (HOSPITAL_BASED_OUTPATIENT_CLINIC_OR_DEPARTMENT_OTHER)
Admission: RE | Admit: 2020-02-14 | Discharge: 2020-02-14 | Disposition: A | Discharge status: Home / Self Care | Payer: 59 | Source: Ambulatory Visit | Attending: Obstetrics & Gynecology | Admitting: Obstetrics & Gynecology

## 2020-02-14 VITALS — BP 123/69 | HR 83 | Ht 62.0 in | Wt 143.0 lb

## 2020-02-14 DIAGNOSIS — E281 Androgen excess: Secondary | ICD-10-CM

## 2020-02-14 DIAGNOSIS — Z1272 Encounter for screening for malignant neoplasm of vagina: Secondary | ICD-10-CM

## 2020-02-14 DIAGNOSIS — Z113 Encounter for screening for infections with a predominantly sexual mode of transmission: Secondary | ICD-10-CM | POA: Diagnosis not present

## 2020-02-14 DIAGNOSIS — N911 Secondary amenorrhea: Secondary | ICD-10-CM | POA: Diagnosis not present

## 2020-02-14 DIAGNOSIS — Z3202 Encounter for pregnancy test, result negative: Secondary | ICD-10-CM

## 2020-02-14 DIAGNOSIS — Z01419 Encounter for gynecological examination (general) (routine) without abnormal findings: Secondary | ICD-10-CM | POA: Diagnosis not present

## 2020-02-14 DIAGNOSIS — E348 Other specified endocrine disorders: Secondary | ICD-10-CM | POA: Diagnosis not present

## 2020-02-14 LAB — POCT URINE PREGNANCY: Preg Test, Ur: NEGATIVE

## 2020-02-14 MED ORDER — MEDROXYPROGESTERONE ACETATE 10 MG PO TABS: 10.0000 mg | ORAL_TABLET | Freq: Every day | ORAL | 2 refills | Status: AC

## 2020-02-14 NOTE — Progress Notes (Signed)
Patient has had amenorrhea since age 29. Can go about a year without periods. Armandina Stammer RN

## 2020-02-14 NOTE — Patient Instructions (Signed)
Polycystic Ovarian Syndrome  Polycystic ovarian syndrome (PCOS) is a common hormonal disorder among women of reproductive age. In most women with PCOS, many small fluid-filled sacs (cysts) grow on the ovaries, and the cysts are not part of a normal menstrual cycle. PCOS can cause problems with your menstrual periods and make it difficult to get pregnant. It can also cause an increased risk of miscarriage with pregnancy. If it is not treated, PCOS can lead to serious health problems, such as diabetes and heart disease. What are the causes? The cause of PCOS is not known, but it may be the result of a combination of certain factors, such as:  Irregular menstrual cycle.  High levels of certain hormones (androgens).  Problems with the hormone that helps to control blood sugar (insulin resistance).  Certain genes. What increases the risk? This condition is more likely to develop in women who have a family history of PCOS. What are the signs or symptoms? Symptoms of PCOS may include:  Multiple ovarian cysts.  Infrequent periods or no periods.  Periods that are too frequent or too heavy.  Unpredictable periods.  Inability to get pregnant (infertility) because of not ovulating.  Increased growth of hair on the face, chest, stomach, back, thumbs, thighs, or toes.  Acne or oily skin. Acne may develop during adulthood, and it may not respond to treatment.  Pelvic pain.  Weight gain or obesity.  Patches of thickened and dark brown or black skin on the neck, arms, breasts, or thighs (acanthosis nigricans).  Excess hair growth on the face, chest, abdomen, or upper thighs (hirsutism). How is this diagnosed? This condition is diagnosed based on:  Your medical history.  A physical exam, including a pelvic exam. Your health care provider may look for areas of increased hair growth on your skin.  Tests, such as: ? Ultrasound. This may be used to examine the ovaries and the lining of the  uterus (endometrium) for cysts. ? Blood tests. These may be used to check levels of sugar (glucose), female hormone (testosterone), and female hormones (estrogen and progesterone) in your blood. How is this treated? There is no cure for PCOS, but treatment can help to manage symptoms and prevent more health problems from developing. Treatment varies depending on:  Your symptoms.  Whether you want to have a baby or whether you need birth control (contraception). Treatment may include nutrition and lifestyle changes along with:  Progesterone hormone to start a menstrual period.  Birth control pills to help you have regular menstrual periods.  Medicines to make you ovulate, if you want to get pregnant.  Medicine to reduce excessive hair growth.  Surgery, in severe cases. This may involve making small holes in one or both of your ovaries. This decreases the amount of testosterone that your body produces. Follow these instructions at home:  Take over-the-counter and prescription medicines only as told by your health care provider.  Follow a healthy meal plan. This can help you reduce the effects of PCOS. ? Eat a healthy diet that includes lean proteins, complex carbohydrates, fresh fruits and vegetables, low-fat dairy products, and healthy fats. Make sure to eat enough fiber.  If you are overweight, lose weight as told by your health care provider. ? Losing 10% of your body weight may improve symptoms. ? Your health care provider can determine how much weight loss is best for you and can help you lose weight safely.  Keep all follow-up visits as told by your health care provider.   This is important. Contact a health care provider if:  Your symptoms do not get better with medicine.  You develop new symptoms. This information is not intended to replace advice given to you by your health care provider. Make sure you discuss any questions you have with your health care provider. Document  Revised: 08/19/2017 Document Reviewed: 02/22/2016 Elsevier Patient Education  2020 Elsevier Inc.  

## 2020-02-14 NOTE — Progress Notes (Signed)
Subjective:     Erica Kaiser is a 29 y.o. female here for a routine exam.  Current complaints: Pt had reg cycles starting at age 68 years until she completed HS. She has had no cycles since she graduated from college. She shaves her legs and under her arms. She does reports neck and lip hair that she shaves. She is recenty sexually active x1 with female partner.    Gynecologic History No LMP recorded. (Menstrual status: Irregular Periods). Contraception: none Last Pap: 05/04/2016. Results were: normal Last mammogram: n/a.   Obstetric History OB History  Gravida Para Term Preterm AB Living  0 0 0 0 0 0  SAB TAB Ectopic Multiple Live Births  0 0 0 0 0   The following portions of the patient's history were reviewed and updated as appropriate: allergies, current medications, past family history, past medical history, past social history, past surgical history and problem list.  Review of Systems Pertinent items are noted in HPI.    Objective:  BP 123/69   Pulse 83   Ht 5\' 2"  (1.575 m)   Wt 143 lb (64.9 kg)   BMI 26.16 kg/m  General Appearance:    Alert, cooperative, no distress, appears stated age  Head:    Normocephalic, without obvious abnormality, atraumatic  Eyes:    conjunctiva/corneas clear, EOM's intact, both eyes  Ears:    Normal external ear canals, both ears  Nose:   Nares normal, septum midline, mucosa normal, no drainage    or sinus tenderness  Throat:   Lips, mucosa, and tongue normal; teeth and gums normal  Neck:   Supple, symmetrical, trachea midline, no adenopathy;    thyroid:  no enlargement/tenderness/nodules  Back:     Symmetric, no curvature, ROM normal, no CVA tenderness  Lungs:     respirations unlabored  Chest Wall:    No tenderness or deformity   Heart:    Regular rate and rhythm  Breast Exam:    No tenderness, masses, or nipple abnormality  Abdomen:     Soft, non-tender, bowel sounds active all four quadrants,    no masses, no organomegaly  Genitalia:     Normal female without lesion, discharge or tenderness   Pt has excessive pubic hair growth. She notes hair on her upper thighs and into her lower abd.  There is no cliteromegaly noted.   Extremities:   Extremities normal, atraumatic, no cyanosis or edema  Pulses:   2+ and symmetric all extremities  Skin:   Skin color, texture, turgor normal, no rashes or lesions    Preg test neg  Assessment:    Healthy female exam.   Hyperandrogenemia evidenced by increased hair growth Suspect PCOS- meets criteria by oligomenorrhea and hyperandrogen state. Need to r/o other causes of hirsuitis and amenorrhea. Reviewed with pt the risks of long term amenorrhea.  Also reviewed with pt the risk of pregnant with being sexually active on no contraception.      Plan:   Erica Kaiser was seen today for annual exam.  Diagnoses and all orders for this visit:  Well female exam with routine gynecological exam -     medroxyPROGESTERone (PROVERA) 10 MG tablet; Take 1 tablet (10 mg total) by mouth daily. Use for ten days  Secondary amenorrhea -     medroxyPROGESTERone (PROVERA) 10 MG tablet; Take 1 tablet (10 mg total) by mouth daily. 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 Pt reported at the end of the visit that she is moving to [place] in 2 days. She still wants to proceed with workup.   Fujiko Picazo L. Harraway-Smith, M.D., Cherlynn June

## 2020-02-14 NOTE — Addendum Note (Signed)
Addended by: Anell Barr on: 02/14/2020 03:20 PM   Modules accepted: Orders

## 2020-02-21 LAB — CYTOLOGY - PAP
Chlamydia: NEGATIVE
Comment: NEGATIVE
Comment: NEGATIVE
Comment: NORMAL
Diagnosis: UNDETERMINED — AB
High risk HPV: POSITIVE — AB
Neisseria Gonorrhea: NEGATIVE

## 2020-02-26 ENCOUNTER — Telehealth: Payer: Self-pay

## 2020-02-26 NOTE — Telephone Encounter (Signed)
Patient called and made aware of pap smear results. Patient has currently moved to [place] and has a web based follow up visit with Dr. Erin Fulling. Patient encourage to find a provider in [place] that can do her colposcopy- patient will research and try and find an OBGYN there for [place] to send records to. Armandina Stammer RN

## 2020-03-19 ENCOUNTER — Telehealth (INDEPENDENT_AMBULATORY_CARE_PROVIDER_SITE_OTHER): Payer: 59 | Admitting: Obstetrics & Gynecology

## 2020-03-19 ENCOUNTER — Encounter: Payer: Self-pay | Admitting: Obstetrics & Gynecology

## 2020-03-19 DIAGNOSIS — N915 Oligomenorrhea, unspecified: Secondary | ICD-10-CM

## 2020-03-19 NOTE — Progress Notes (Signed)
    GYNECOLOGY VIRTUAL VISIT ENCOUNTER NOTE  Provider location: [facility]   I connected with Erica Kaiser on 03/19/20 at  2:30 PM EDT by MyChart Video Encounter at home and verified that I am speaking with the correct person using two identifiers.   I discussed the limitations, risks, security and privacy concerns of performing an evaluation and management service virtually and the availability of in person appointments. I also discussed with the patient that there may be a patient responsible charge related to this service. The patient expressed understanding and agreed to proceed.   History:  Erica Kaiser is a 29 y.o. G0P0000 female being evaluated today for oligomenorrhea. She denies any abnormal vaginal discharge, bleeding, pelvic pain or other concerns.  She did have a period in June. She reports that when she was in school she had normal menses and she was about 15 pounds heavier.       Past Medical History:  Diagnosis Date  . No known health problems   . Vaginal Pap smear, abnormal    Past Surgical History:  Procedure Laterality Date  . NO PAST SURGERIES     The following portions of the patient's history were reviewed and updated as appropriate: allergies, current medications, past family history, past medical history, past social history, past surgical history and problem list.   Health Maintenance:  Last PAP 01/2020 ASCUS with +HPV    Review of Systems:  Pertinent items noted in HPI and remainder of comprehensive ROS otherwise negative.  Physical Exam:   General:  Alert, oriented and cooperative. Patient appears to be in no acute distress.  Mental Status: Normal mood and affect. Normal behavior. Normal judgment and thought content.   Respiratory: Normal respiratory effort, no problems with respiration noted  Rest of physical exam deferred due to type of encounter  Labs and Imaging   Assessment and Plan:     1.  Hypomenorrhea/oligomenorrhea Pt does not want to take OCPs. Researched and wants to diet and exercise.  Will work on losing 10-67min.      I discussed the assessment and treatment plan with the patient. The patient was provided an opportunity to ask questions and all were answered. The patient agreed with the plan and demonstrated an understanding of the instructions.   The patient was advised to call back or seek an in-person evaluation/go to the ED if the symptoms worsen or if the condition fails to improve as anticipated.  I provided 17 minutes of face-to-face time during this encounter.   Willodean Rosenthal, MD [facility] for [employer]

## 2020-06-19 ENCOUNTER — Encounter: Payer: Self-pay | Admitting: Family Medicine

## 2022-03-19 IMAGING — US US TRANSVAGINAL NON-OB
1 series · 14 of 25 positions shown · non-contrast
Comparison: None.

CLINICAL DATA: Amenorrhea, evaluate for polycystic ovaries

EXAM:
ULTRASOUND PELVIS TRANSVAGINAL
TECHNIQUE: Transvaginal ultrasound examination of the pelvis was performed
including evaluation of the uterus, ovaries, adnexal regions, and
pelvic cul-de-sac.

[Series 1: us transvaginal non-ob · 25 acquisitions, 14 frames shown]
[im 1/25]
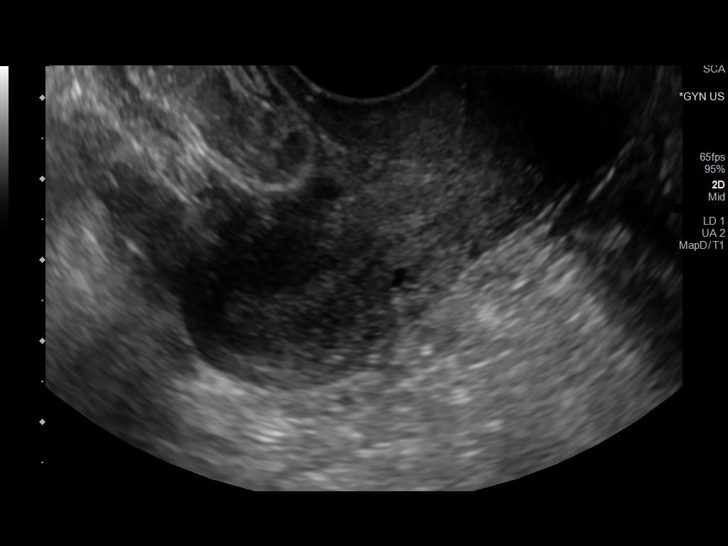
[im 3/25]
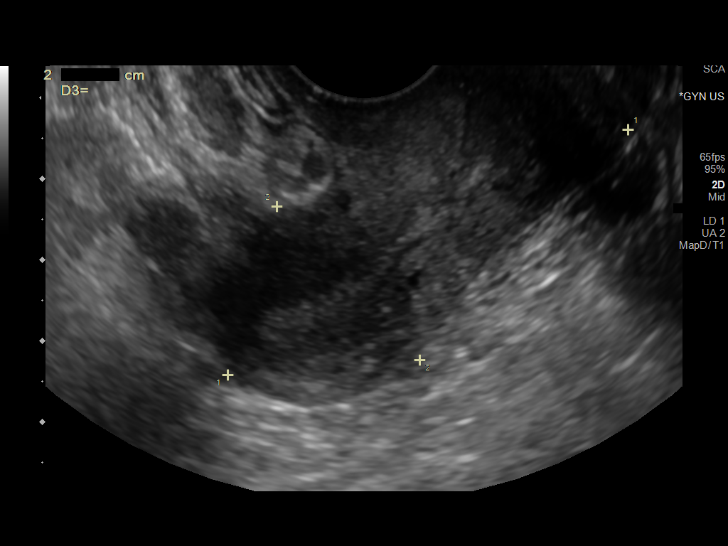
[im 5/25]
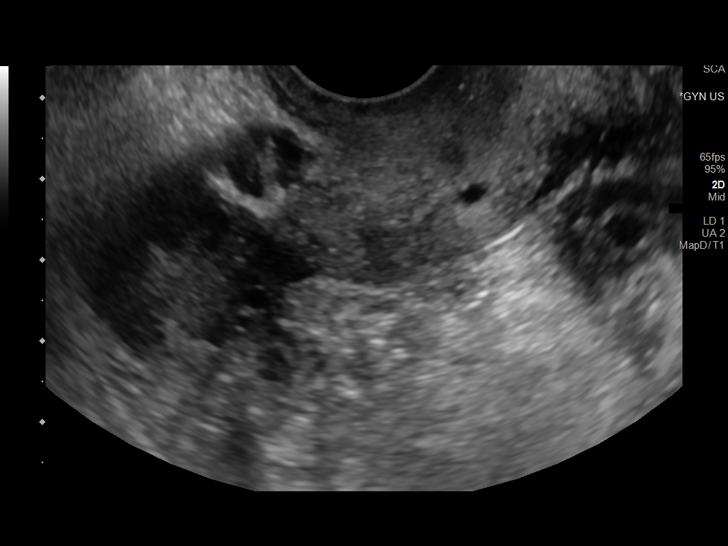
[im 7/25]
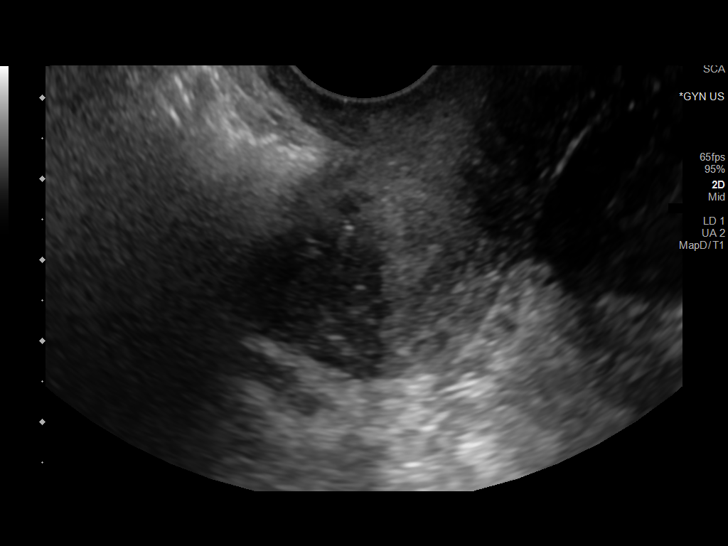
[im 9/25]
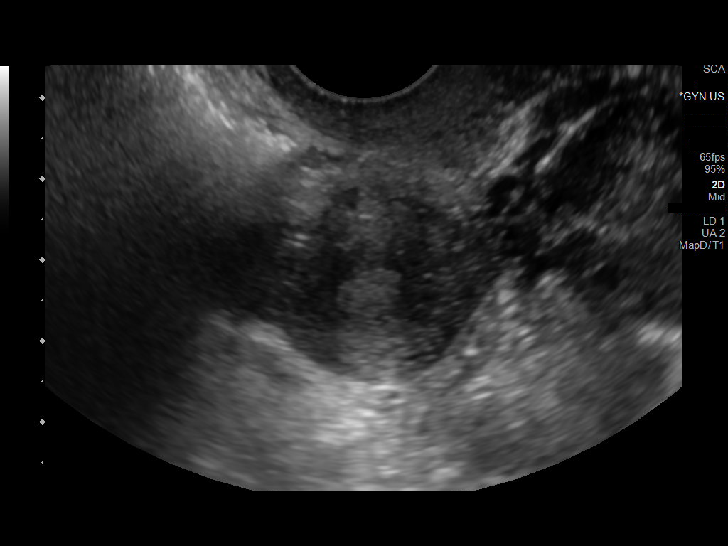
[im 10/25]
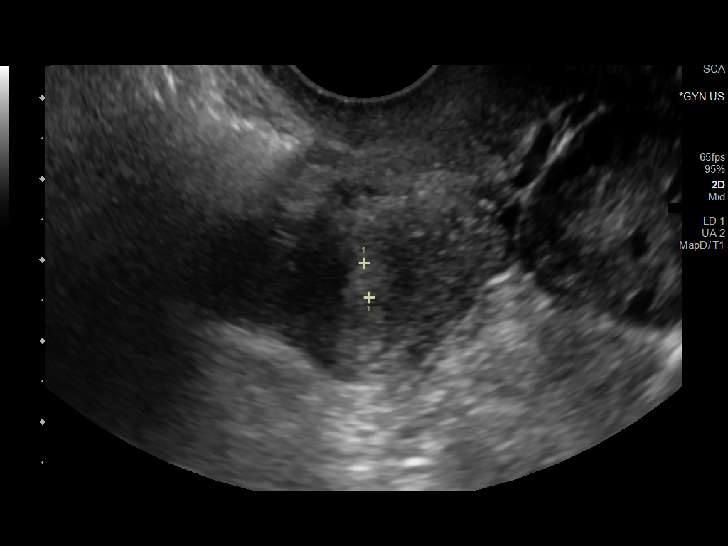
[im 12/25]
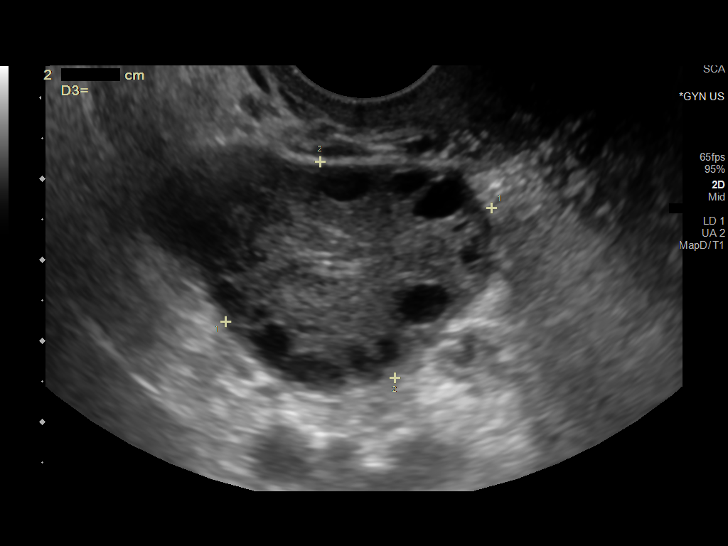
[im 14/25]
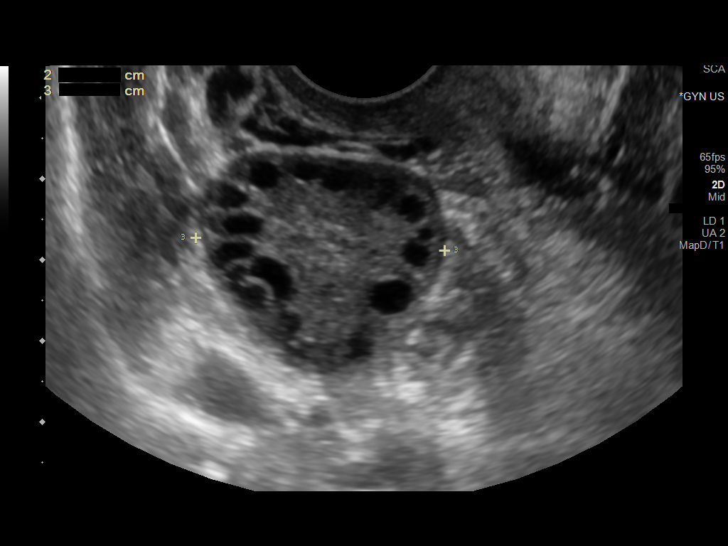
[im 16/25]
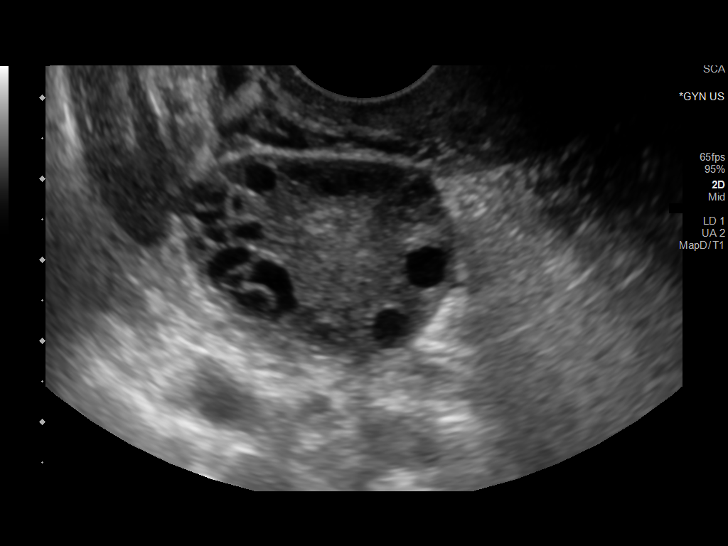
[im 17/25]
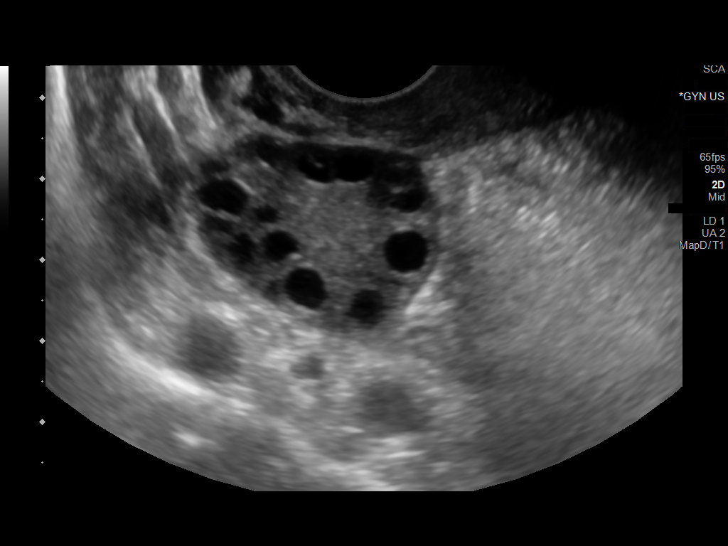
[im 19/25]
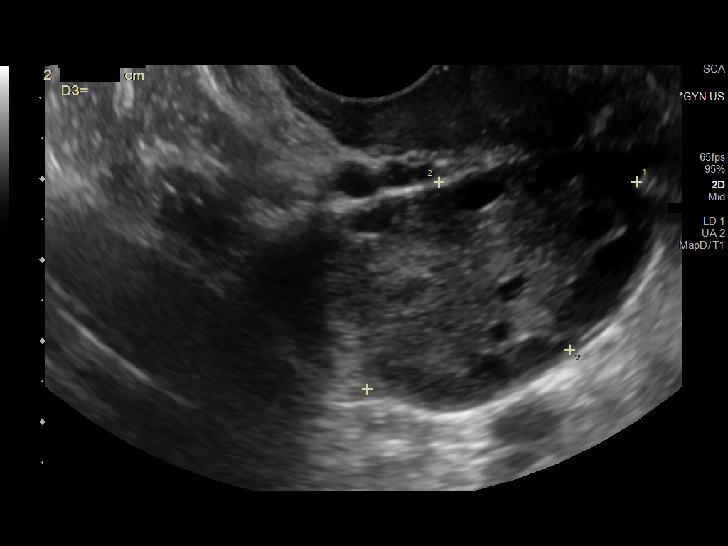
[im 21/25]
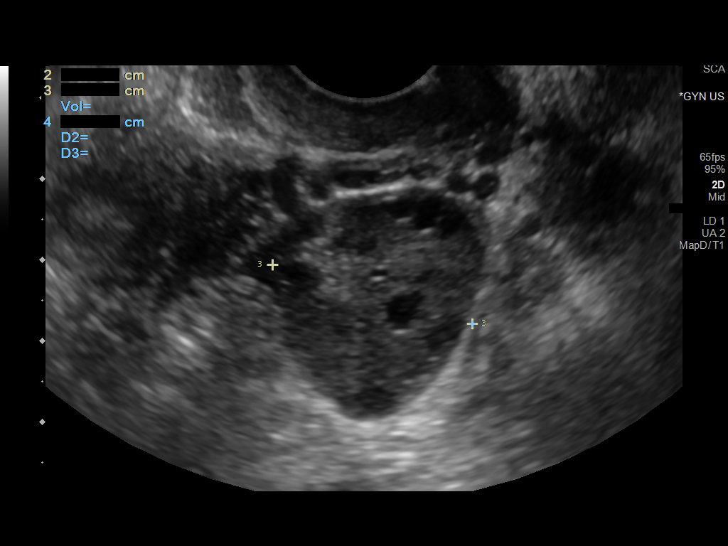
[im 23/25]
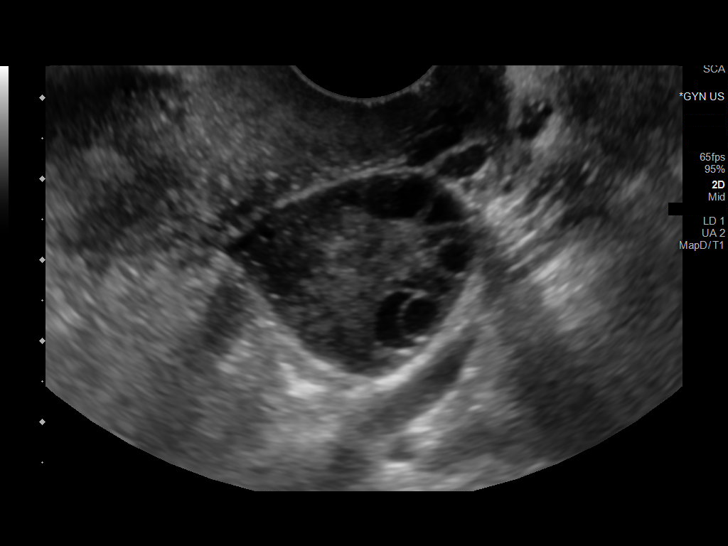
[im 25/25]
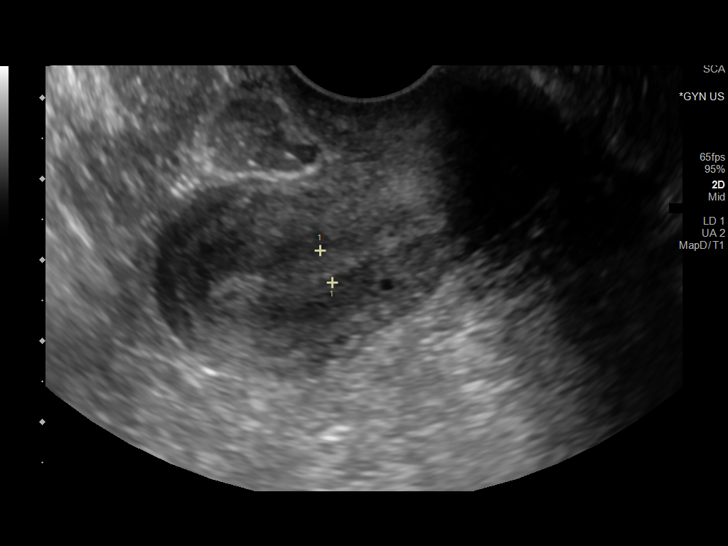

[14 of 25 positions shown; findings below may reference images not displayed]

FINDINGS: Uterus

Measurements: 5.8 x 2.6 x 2.8 cm = volume: 21.8 mL. No fibroids or
other mass visualized.

Endometrium

Thickness: 4 mm.  No focal abnormality visualized.

Right ovary

Measurements: 3.6 x 2.8 x 3.0 cm = volume: 16.2 mL. Multiple small
immature peripheral follicles.

Left ovary

Measurements: 4.2 x 2.6 x 2.6 cm = volume: 14.9 mL. Multiple small
immature peripheral follicles

Other findings:  No abnormal free fluid
IMPRESSION: Enlarged bilateral ovaries with multiple small immature peripheral
follicles. This appearance is compatible with the clinical diagnosis
of PCOS.
